# Patient Record
Sex: Male | Born: 1975 | Race: White | Hispanic: No | Marital: Single | Smoking: Former smoker
Health system: Southern US, Community
[De-identification: ages and names within clinical notes are randomized; demographics above are authoritative.]

## PROBLEM LIST (undated history)

## (undated) DIAGNOSIS — M545 Low back pain, unspecified: Secondary | ICD-10-CM

## (undated) DIAGNOSIS — J302 Other seasonal allergic rhinitis: Secondary | ICD-10-CM

## (undated) DIAGNOSIS — F419 Anxiety disorder, unspecified: Secondary | ICD-10-CM

## (undated) DIAGNOSIS — K429 Umbilical hernia without obstruction or gangrene: Secondary | ICD-10-CM

## (undated) DIAGNOSIS — F32A Depression, unspecified: Secondary | ICD-10-CM

## (undated) DIAGNOSIS — F329 Major depressive disorder, single episode, unspecified: Secondary | ICD-10-CM

## (undated) DIAGNOSIS — R51 Headache: Secondary | ICD-10-CM

---

## 2010-03-31 ENCOUNTER — Emergency Department (HOSPITAL_COMMUNITY): Admission: EM | Admit: 2010-03-31 | Discharge: 2010-03-31 | Payer: Self-pay | Admitting: Emergency Medicine

## 2010-12-28 ENCOUNTER — Emergency Department (HOSPITAL_BASED_OUTPATIENT_CLINIC_OR_DEPARTMENT_OTHER)
Admission: EM | Admit: 2010-12-28 | Discharge: 2010-12-28 | Disposition: A | Discharge status: Home / Self Care | Payer: Self-pay | Attending: Emergency Medicine | Admitting: Emergency Medicine

## 2010-12-28 DIAGNOSIS — R109 Unspecified abdominal pain: Secondary | ICD-10-CM | POA: Insufficient documentation

## 2010-12-28 DIAGNOSIS — K439 Ventral hernia without obstruction or gangrene: Secondary | ICD-10-CM | POA: Insufficient documentation

## 2011-03-11 ENCOUNTER — Ambulatory Visit (HOSPITAL_COMMUNITY): Payer: Self-pay | Admitting: Psychiatry

## 2011-04-03 ENCOUNTER — Ambulatory Visit (HOSPITAL_COMMUNITY): Payer: Self-pay | Admitting: Psychiatry

## 2011-10-02 ENCOUNTER — Emergency Department (HOSPITAL_COMMUNITY)
Admission: EM | Admit: 2011-10-02 | Discharge: 2011-10-02 | Disposition: A | Discharge status: Home / Self Care | Payer: No Typology Code available for payment source | Attending: Emergency Medicine | Admitting: Emergency Medicine

## 2011-10-02 ENCOUNTER — Emergency Department (HOSPITAL_COMMUNITY): Payer: No Typology Code available for payment source

## 2011-10-02 ENCOUNTER — Encounter: Payer: Self-pay | Admitting: *Deleted

## 2011-10-02 DIAGNOSIS — S8990XA Unspecified injury of unspecified lower leg, initial encounter: Secondary | ICD-10-CM | POA: Insufficient documentation

## 2011-10-02 DIAGNOSIS — M25579 Pain in unspecified ankle and joints of unspecified foot: Secondary | ICD-10-CM | POA: Insufficient documentation

## 2011-10-02 DIAGNOSIS — IMO0002 Reserved for concepts with insufficient information to code with codable children: Secondary | ICD-10-CM | POA: Insufficient documentation

## 2011-10-02 DIAGNOSIS — M7989 Other specified soft tissue disorders: Secondary | ICD-10-CM | POA: Insufficient documentation

## 2011-10-02 DIAGNOSIS — S99922A Unspecified injury of left foot, initial encounter: Secondary | ICD-10-CM

## 2011-10-02 DIAGNOSIS — Y9229 Other specified public building as the place of occurrence of the external cause: Secondary | ICD-10-CM | POA: Insufficient documentation

## 2011-10-02 HISTORY — DX: Major depressive disorder, single episode, unspecified: F32.9

## 2011-10-02 HISTORY — DX: Umbilical hernia without obstruction or gangrene: K42.9

## 2011-10-02 HISTORY — DX: Depression, unspecified: F32.A

## 2011-10-02 MED ORDER — OXYCODONE-ACETAMINOPHEN 5-325 MG PO TABS: 2.0000 | ORAL_TABLET | ORAL | Status: AC | PRN

## 2011-10-02 MED ORDER — OXYCODONE-ACETAMINOPHEN 5-325 MG PO TABS: 1.0000 | ORAL_TABLET | Freq: Once | ORAL | Status: DC
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
## 2011-10-02 NOTE — ED Notes (Signed)
Patient is at the desk and upset that he has not been treated more promptly. The patient is choosing to have no further care with [place] at [facility]. Patient is leaving AMA - PEr PA no orders to be carried out

## 2011-10-02 NOTE — ED Notes (Signed)
Pt presents with abrasion to left knee, +pp to left injured foot

## 2011-10-08 NOTE — ED Provider Notes (Signed)
Medical screening examination/treatment/procedure(s) were performed by non-physician practitioner and as supervising physician I was immediately available for consultation/collaboration.  Pershing Skidmore, MD 10/08/11 1956 

## 2012-01-26 ENCOUNTER — Emergency Department (INDEPENDENT_AMBULATORY_CARE_PROVIDER_SITE_OTHER): Payer: BC Managed Care – PPO

## 2012-01-26 ENCOUNTER — Emergency Department (HOSPITAL_BASED_OUTPATIENT_CLINIC_OR_DEPARTMENT_OTHER)
Admission: EM | Admit: 2012-01-26 | Discharge: 2012-01-26 | Disposition: A | Discharge status: Home / Self Care | Payer: BC Managed Care – PPO | Attending: Emergency Medicine | Admitting: Emergency Medicine

## 2012-01-26 ENCOUNTER — Encounter (HOSPITAL_BASED_OUTPATIENT_CLINIC_OR_DEPARTMENT_OTHER): Payer: Self-pay | Admitting: Emergency Medicine

## 2012-01-26 DIAGNOSIS — K5730 Diverticulosis of large intestine without perforation or abscess without bleeding: Secondary | ICD-10-CM

## 2012-01-26 DIAGNOSIS — K7689 Other specified diseases of liver: Secondary | ICD-10-CM | POA: Insufficient documentation

## 2012-01-26 DIAGNOSIS — R109 Unspecified abdominal pain: Secondary | ICD-10-CM

## 2012-01-26 DIAGNOSIS — K429 Umbilical hernia without obstruction or gangrene: Secondary | ICD-10-CM | POA: Insufficient documentation

## 2012-01-26 DIAGNOSIS — R110 Nausea: Secondary | ICD-10-CM | POA: Insufficient documentation

## 2012-01-26 DIAGNOSIS — R10819 Abdominal tenderness, unspecified site: Secondary | ICD-10-CM | POA: Insufficient documentation

## 2012-01-26 LAB — COMPREHENSIVE METABOLIC PANEL
ALT: 43 U/L (ref 0–53)
Calcium: 9.9 mg/dL (ref 8.4–10.5)
Chloride: 101 mEq/L (ref 96–112)
Creatinine, Ser: 0.8 mg/dL (ref 0.50–1.35)
Glucose, Bld: 90 mg/dL (ref 70–99)
Sodium: 137 mEq/L (ref 135–145)
Total Bilirubin: 0.4 mg/dL (ref 0.3–1.2)
Total Protein: 7.2 g/dL (ref 6.0–8.3)

## 2012-01-26 LAB — CBC
HCT: 44.1 % (ref 39.0–52.0)
RBC: 4.68 MIL/uL (ref 4.22–5.81)
RDW: 12.5 % (ref 11.5–15.5)

## 2012-01-26 LAB — DIFFERENTIAL
Basophils Absolute: 0 10*3/uL (ref 0.0–0.1)
Basophils Relative: 0 % (ref 0–1)
Eosinophils Absolute: 0.1 10*3/uL (ref 0.0–0.7)
Lymphocytes Relative: 27 % (ref 12–46)
Lymphs Abs: 2.9 10*3/uL (ref 0.7–4.0)
Monocytes Absolute: 0.6 10*3/uL (ref 0.1–1.0)
Neutro Abs: 7.2 10*3/uL (ref 1.7–7.7)

## 2012-01-26 LAB — URINALYSIS, ROUTINE W REFLEX MICROSCOPIC
Hgb urine dipstick: NEGATIVE
Nitrite: NEGATIVE
Protein, ur: NEGATIVE mg/dL
Urobilinogen, UA: 0.2 mg/dL (ref 0.0–1.0)
pH: 7 (ref 5.0–8.0)

## 2012-01-26 MED ORDER — ONDANSETRON HCL 4 MG/2ML IJ SOLN
4.0000 mg | Freq: Once | INTRAMUSCULAR | Status: AC
Start: 2012-01-26 — End: 2012-01-26
  Administered 2012-01-26: 4 mg via INTRAVENOUS
  Filled 2012-01-26: qty 2

## 2012-01-26 MED ORDER — MORPHINE SULFATE 4 MG/ML IJ SOLN
4.0000 mg | Freq: Once | INTRAMUSCULAR | Status: AC
  Administered 2012-01-26: 4 mg via INTRAVENOUS
  Filled 2012-01-26: qty 1

## 2012-01-26 MED ORDER — IOHEXOL 300 MG/ML  SOLN
36.0000 mL | Freq: Once | INTRAMUSCULAR | Status: AC | PRN
  Administered 2012-01-26: 36 mL via INTRAVENOUS

## 2012-01-26 MED ORDER — SODIUM CHLORIDE 0.9 % IV SOLN
Freq: Once | INTRAVENOUS | Status: AC
  Administered 2012-01-26: 17:00:00 via INTRAVENOUS

## 2012-01-26 MED ORDER — IOHEXOL 300 MG/ML  SOLN
100.0000 mL | Freq: Once | INTRAMUSCULAR | Status: AC | PRN
  Administered 2012-01-26: 100 mL via INTRAVENOUS

## 2012-01-26 MED ORDER — OXYCODONE-ACETAMINOPHEN 5-325 MG PO TABS: 1.0000 | ORAL_TABLET | Freq: Four times a day (QID) | ORAL | Status: AC | PRN

## 2012-01-26 NOTE — ED Provider Notes (Signed)
History     CSN: [id number]  Arrival date & time 01/26/12  1502   First MD Initiated Contact with Patient 01/26/12 1543      Chief Complaint  Patient presents with  . Abdominal Pain    (Consider location/radiation/quality/duration/timing/severity/associated sxs/prior treatment) HPI Comments: Was told one year ago had a hernia but had no health insurance.  Has an appointment with General Surgery in two weeks.  Patient is a 36 y.o. male presenting with abdominal pain. The history is provided by the patient.  Abdominal Pain The primary symptoms of the illness include abdominal pain and nausea. The primary symptoms of the illness do not include fever, vomiting, diarrhea or dysuria. Episode onset: ongoing for one year, worse the past 2 days. The onset of the illness was gradual. The problem has been rapidly worsening.  The patient has not had a change in bowel habit.    Past Medical History  Diagnosis Date  . Umbilical hernia   . Depression   . Bipolar disorder   . Hernia     History reviewed. No pertinent past surgical history.  No family history on file.  History  Substance Use Topics  . Smoking status: Former Games developer  . Smokeless tobacco: Not on file  . Alcohol Use: No     ocassionally      Review of Systems  Constitutional: Negative for fever.  Gastrointestinal: Positive for nausea and abdominal pain. Negative for vomiting and diarrhea.  Genitourinary: Negative for dysuria.  All other systems reviewed and are negative.    Allergies  Fish allergy and Imipramine  Home Medications   Current Outpatient Rx  Name Route Sig Dispense Refill  . ACETAMINOPHEN ER 650 MG PO TBCR Oral Take 650 mg by mouth 2 (two) times daily as needed. For pain    . DIPHENHYDRAMINE HCL 25 MG PO TABS Oral Take 25 mg by mouth daily.    . TRIPLE ANTIBIOTIC [phone] EX OINT Topical Apply 1 application topically 2 (two) times daily as needed. For cut and burn      BP 116/59  Pulse 78   Temp(Src) 98.4 F (36.9 C) (Oral)  Resp 24  Ht 6' (1.829 m)  Wt 185 lb (83.915 kg)  BMI 25.09 kg/m2  SpO2 95%  Physical Exam  Nursing note and vitals reviewed. Constitutional: He is oriented to person, place, and time. He appears well-developed and well-nourished. No distress.  HENT:  Head: Normocephalic and atraumatic.  Neck: Normal range of motion. Neck supple.  Cardiovascular: Normal rate and regular rhythm.   Pulmonary/Chest: Effort normal and breath sounds normal. No respiratory distress.  Abdominal: Soft. Bowel sounds are normal. He exhibits no distension. There is tenderness. There is no rebound and no guarding.       There is ttp in all four quadrants, but is worse in the umbilicus.  There is a small hernia palpable that is ttp but does not appear to be incarcerated.  Musculoskeletal: Normal range of motion. He exhibits no edema.  Neurological: He is alert and oriented to person, place, and time.  Skin: Skin is warm and dry. He is not diaphoretic.    ED Course  Procedures (including critical care time)   Labs Reviewed  CBC  DIFFERENTIAL  COMPREHENSIVE METABOLIC PANEL  LIPASE, BLOOD  URINALYSIS, ROUTINE W REFLEX MICROSCOPIC   No results found.   No diagnosis found.    MDM  The ct and labs look okay with the exception of a mildly elevated wbc, of  which I am unsure the significance.  The ct shows a small fat-containing umbilical hernia with no evidence of obstruction bowel involvement.  Will discharge to home with pain meds.  To see general surgery as scheduled.          Geoffery Lyons, MD 01/26/12 [phone]

## 2012-01-26 NOTE — Discharge Instructions (Signed)
Abdominal Pain  Abdominal pain can be caused by many things. Your caregiver decides the seriousness of your pain by an examination and possibly blood tests and X-rays. Many cases can be observed and treated at home. Most abdominal pain is not caused by a disease and will probably improve without treatment. However, in many cases, more time must pass before a clear cause of the pain can be found. Before that point, it may not be known if you need more testing, or if hospitalization or surgery is needed.  HOME CARE INSTRUCTIONS    Do not take laxatives unless directed by your caregiver.   Take pain medicine only as directed by your caregiver.   Only take over-the-counter or prescription medicines for pain, discomfort, or fever as directed by your caregiver.   Try a clear liquid diet (broth, tea, or water) for as long as directed by your caregiver. Slowly move to a bland diet as tolerated.  SEEK IMMEDIATE MEDICAL CARE IF:    The pain does not go away.   You have a fever.   You keep throwing up (vomiting).   The pain is felt only in portions of the abdomen. Pain in the right side could possibly be appendicitis. In an adult, pain in the left lower portion of the abdomen could be colitis or diverticulitis.   You pass bloody or black tarry stools.  MAKE SURE YOU:    Understand these instructions.   Will watch your condition.   Will get help right away if you are not doing well or get worse.  Document Released: 07/08/2005 Document Revised: 09/17/2011 Document Reviewed: 05/16/2008  ExitCare Patient Information 2012 [employer]  Hernia  A hernia occurs when an internal organ pushes out through a weak spot in the abdominal wall. Hernias most commonly occur in the groin and around the navel. Hernias often can be pushed back into place (reduced). Most hernias tend to get worse over time. Some abdominal hernias can get stuck in the opening (irreducible or incarcerated hernia) and cannot be reduced. An irreducible  abdominal hernia which is tightly squeezed into the opening is at risk for impaired blood supply (strangulated hernia). A strangulated hernia is a medical emergency. Because of the risk for an irreducible or strangulated hernia, surgery may be recommended to repair a hernia.  CAUSES    Heavy lifting.   Prolonged coughing.   Straining to have a bowel movement.   A cut (incision) made during an abdominal surgery.  HOME CARE INSTRUCTIONS    Bed rest is not required. You may continue your normal activities.   Avoid lifting more than 10 pounds (4.5 kg) or straining.   Cough gently. If you are a smoker it is best to stop. Even the best hernia repair can break down with the continual strain of coughing. Even if you do not have your hernia repaired, a cough will continue to aggravate the problem.   Do not wear anything tight over your hernia. Do not try to keep it in with an outside bandage or truss. These can damage abdominal contents if they are trapped within the hernia sac.   Eat a normal diet.   Avoid constipation. Straining over long periods of time will increase hernia size and encourage breakdown of repairs. If you cannot do this with diet alone, stool softeners may be used.  SEEK IMMEDIATE MEDICAL CARE IF:    You have a fever.   You develop increasing abdominal pain.   You feel nauseous   or vomit.   Your hernia is stuck outside the abdomen, looks discolored, feels hard, or is tender.   You have any changes in your bowel habits or in the hernia that are unusual for you.   You have increased pain or swelling around the hernia.   You cannot push the hernia back in place by applying gentle pressure while lying down.  MAKE SURE YOU:    Understand these instructions.   Will watch your condition.   Will get help right away if you are not doing well or get worse.  Document Released: 09/28/2005 Document Revised: 09/17/2011 Document Reviewed: 05/17/2008  Exit[employer] Patient Information 2012 [employer]

## 2012-01-26 NOTE — ED Notes (Signed)
Abd pain x 1 year on and off. Dx with hernia 1 year ago - states almost passed out from the pain today. Has an appt with surgeon on May 3

## 2012-02-10 DIAGNOSIS — K429 Umbilical hernia without obstruction or gangrene: Secondary | ICD-10-CM

## 2012-02-10 HISTORY — DX: Umbilical hernia without obstruction or gangrene: K42.9

## 2012-02-12 ENCOUNTER — Ambulatory Visit (INDEPENDENT_AMBULATORY_CARE_PROVIDER_SITE_OTHER): Payer: BC Managed Care – PPO | Admitting: General Surgery

## 2012-02-12 ENCOUNTER — Encounter (INDEPENDENT_AMBULATORY_CARE_PROVIDER_SITE_OTHER): Payer: Self-pay | Admitting: General Surgery

## 2012-02-12 VITALS — BP 122/82 | HR 66 | Temp 97.0°F | Resp 10 | Wt 237.0 lb

## 2012-02-12 DIAGNOSIS — K429 Umbilical hernia without obstruction or gangrene: Secondary | ICD-10-CM | POA: Insufficient documentation

## 2012-02-12 NOTE — Progress Notes (Signed)
Patient ID: Julian Cook, male   DOB: 02/13/1976, 35 y.o.   MRN: [id number]  Chief Complaint  Patient presents with  . Umbilical Hernia    HPI Julian Cook is a 35 y.o. male.   HPI 35-year-old Caucasian male was referred by Dr Delo because of umbilical hernia. He states that his had an umbilical hernia for about one year. He states that it started bothering him several weeks ago. He describes soreness in his abdominal wall. He also describes it as it is belly button being on fire. He also complains of some shortness of breath. He denies any history of asthma. He denies any cough or sputum production. He is able to walk around at work without any shortness of breath. He had a severe episode of periumbilical pain prompting him to go to the emergency room. In the ER, a CT scan of his abdomen was performed which only revealed a small umbilical hernia. It only contained fat. His labs were normal. He denies any chest pain or chest pressure. He has had some nausea. He denies any vomiting. He will occasionally get some diarrhea after eating certain foods. He denies any melena or hematochezia. He denies any weight loss. He denies any travel or pain urinating. He quit smoking last year Past Medical History  Diagnosis Date  . Umbilical hernia   . Depression   . Bipolar disorder   . Hernia     No past surgical history on file.  No family history on file.  Social History History  Substance Use Topics  . Smoking status: Former Smoker  . Smokeless tobacco: Not on file  . Alcohol Use: No     ocassionally    Allergies  Allergen Reactions  . Fish Allergy Anaphylaxis  . Imipramine Rash    Current Outpatient Prescriptions  Medication Sig Dispense Refill  . acetaminophen (TYLENOL ARTHRITIS PAIN) 650 MG CR tablet Take 650 mg by mouth 2 (two) times daily as needed. For pain      . diphenhydrAMINE (BENADRYL) 25 MG tablet Take 25 mg by mouth daily.      . neomycin-bacitracin-polymyxin (NEOSPORIN)  5-400-5000 ointment Apply 1 application topically 2 (two) times daily as needed. For cut and burn        Review of Systems Review of Systems  Constitutional: Negative for fever, chills, appetite change, fatigue and unexpected weight change.  HENT: Negative for neck pain and neck stiffness.   Eyes: Negative for photophobia and visual disturbance.  Respiratory: Positive for shortness of breath. Negative for cough, chest tightness, wheezing and stridor.   Cardiovascular: Negative for chest pain, palpitations and leg swelling.  Gastrointestinal:       See hpi  Genitourinary: Negative for dysuria and hematuria.  Musculoskeletal: Negative for back pain and gait problem.  Neurological: Negative for seizures, speech difficulty, light-headedness and numbness.  Psychiatric/Behavioral: Negative for confusion and decreased concentration.    Blood pressure 122/82, pulse 66, temperature 97 F (36.1 C), resp. rate 10, weight 237 lb (107.502 kg).  Physical Exam Physical Exam  Vitals reviewed. Constitutional: He is oriented to person, place, and time. He appears well-developed and well-nourished. No distress.       obese  HENT:  Head: Normocephalic and atraumatic.  Right Ear: External ear normal.  Left Ear: External ear normal.  Mouth/Throat: No oropharyngeal exudate.  Eyes: Conjunctivae are normal. No scleral icterus.  Neck: Normal range of motion. Neck supple. No tracheal deviation present. No thyromegaly present.  Cardiovascular: Normal rate   and normal heart sounds.   No murmur heard. Pulmonary/Chest: Effort normal and breath sounds normal. No stridor. No respiratory distress. He has no wheezes. He has no rales.  Abdominal: Soft. Bowel sounds are normal. He exhibits no distension.       Mild tenderness scattered; fascial defect at umbilicus about 1.5cm. Reducible. No skin changes.   Musculoskeletal: Normal range of motion. He exhibits no edema and no tenderness.  Lymphadenopathy:    He has  no cervical adenopathy.  Neurological: He is alert and oriented to person, place, and time. He exhibits normal muscle tone.  Skin: Skin is warm and dry. No rash noted. He is not diaphoretic. No erythema.  Psychiatric: Judgment and thought content normal.    Data Reviewed [facility] note from 4/16 CT abd/pelvis 01/2012 - small 1.7cm wide umbilical hernia  Assessment    Obesity Symptomatic umbilical hernia    Plan    We discussed the etiology of ventral hernias. We discussed the signs and symptoms of incarceration and strangulation. The patient was given educational material. I also drew diagrams.  We discussed nonoperative and operative management. With respect to operative management, we discussed open repair. I discussed the typical aftercare with the procedure.   The patient has elected to proceed with OPEN UMBILICAL HERNIA REPAIR WITH MESH  We discussed the risk and benefits of surgery including but not limited to bleeding, infection, injury to surrounding structures, hernia recurrence, mesh complications, hematoma/seroma formation, blood clot formation, urinary retention, post operative ileus, general anesthesia risk, long-term abdominal pain. We discussed that this procedure can be quite uncomfortable and difficult to recover from based on how the mesh is secured to the abdominal wall. We discussed the importance of avoiding heavy lifting and straining for a period of 6 weeks. I do not believe a hernia repair will improve his shortness of breath. We will check a CXR and labs prior to surgery.  EPIC was down at the time of the pt's visit so our office will contact him to schedule surgery.   Gearld Kerstein M. Estefany Goebel, MD, FACS General, Bariatric, & Minimally Invasive Surgery [facility]         Tamar Miano M 02/12/2012, 2:02 PM    

## 2012-03-01 ENCOUNTER — Encounter (HOSPITAL_BASED_OUTPATIENT_CLINIC_OR_DEPARTMENT_OTHER): Payer: Self-pay | Admitting: *Deleted

## 2012-03-09 ENCOUNTER — Telehealth (INDEPENDENT_AMBULATORY_CARE_PROVIDER_SITE_OTHER): Payer: Self-pay | Admitting: General Surgery

## 2012-03-09 NOTE — Telephone Encounter (Signed)
LISA FROM [facility] CALLED TO REQUEST ORDERS TO BE PUT IN EPIC FOR PT Julian Cook/DOB-1975/12/15. SURGERY IS TOMORROW/03-10-12/GY

## 2012-03-09 NOTE — Progress Notes (Signed)
Spoke with Germaine at Dr. Tawana Scale office to inform them that no pre-op orders are in the computer -- she will send him an e-mail.

## 2012-03-10 ENCOUNTER — Encounter (HOSPITAL_BASED_OUTPATIENT_CLINIC_OR_DEPARTMENT_OTHER): Payer: Self-pay | Admitting: Anesthesiology

## 2012-03-10 ENCOUNTER — Ambulatory Visit (HOSPITAL_BASED_OUTPATIENT_CLINIC_OR_DEPARTMENT_OTHER): Payer: BC Managed Care – PPO | Admitting: Anesthesiology

## 2012-03-10 ENCOUNTER — Encounter (HOSPITAL_BASED_OUTPATIENT_CLINIC_OR_DEPARTMENT_OTHER): Payer: Self-pay

## 2012-03-10 ENCOUNTER — Encounter (HOSPITAL_BASED_OUTPATIENT_CLINIC_OR_DEPARTMENT_OTHER)
Admission: RE | Disposition: A | Discharge status: Home / Self Care | Payer: Self-pay | Source: Ambulatory Visit | Attending: General Surgery

## 2012-03-10 ENCOUNTER — Other Ambulatory Visit (INDEPENDENT_AMBULATORY_CARE_PROVIDER_SITE_OTHER): Payer: Self-pay | Admitting: General Surgery

## 2012-03-10 ENCOUNTER — Ambulatory Visit (HOSPITAL_BASED_OUTPATIENT_CLINIC_OR_DEPARTMENT_OTHER)
Admission: RE | Admit: 2012-03-10 | Discharge: 2012-03-10 | Disposition: A | Discharge status: Home / Self Care | Payer: BC Managed Care – PPO | Source: Ambulatory Visit | Attending: General Surgery | Admitting: General Surgery

## 2012-03-10 DIAGNOSIS — K429 Umbilical hernia without obstruction or gangrene: Secondary | ICD-10-CM

## 2012-03-10 HISTORY — DX: Other seasonal allergic rhinitis: J30.2

## 2012-03-10 HISTORY — PX: UMBILICAL HERNIA REPAIR: SHX196

## 2012-03-10 HISTORY — DX: Headache: R51

## 2012-03-10 SURGERY — REPAIR, HERNIA, UMBILICAL, ADULT
Anesthesia: General | Site: Abdomen | Wound class: Clean

## 2012-03-10 MED ORDER — ONDANSETRON HCL 4 MG/2ML IJ SOLN
INTRAMUSCULAR | Status: DC | PRN
  Administered 2012-03-10: 4 mg via INTRAVENOUS

## 2012-03-10 MED ORDER — MIDAZOLAM HCL 2 MG/2ML IJ SOLN: 1.0000 mg | INTRAMUSCULAR | Status: DC | PRN

## 2012-03-10 MED ORDER — ROCURONIUM BROMIDE 100 MG/10ML IV SOLN
INTRAVENOUS | Status: DC | PRN
  Administered 2012-03-10: 30 mg via INTRAVENOUS

## 2012-03-10 MED ORDER — CEFAZOLIN SODIUM-DEXTROSE 2-3 GM-% IV SOLR: 2.0000 g | INTRAVENOUS | Status: DC

## 2012-03-10 MED ORDER — LIDOCAINE HCL (CARDIAC) 20 MG/ML IV SOLN
INTRAVENOUS | Status: DC | PRN
  Administered 2012-03-10: 100 mg via INTRAVENOUS

## 2012-03-10 MED ORDER — OXYCODONE-ACETAMINOPHEN 5-325 MG PO TABS
1.0000 | ORAL_TABLET | Freq: Once | ORAL | Status: AC
  Administered 2012-03-10: 1 via ORAL

## 2012-03-10 MED ORDER — DEXAMETHASONE SODIUM PHOSPHATE 4 MG/ML IJ SOLN
INTRAMUSCULAR | Status: DC | PRN
  Administered 2012-03-10: 10 mg via INTRAVENOUS

## 2012-03-10 MED ORDER — PROPOFOL 10 MG/ML IV EMUL
INTRAVENOUS | Status: DC | PRN
  Administered 2012-03-10: 200 mg via INTRAVENOUS
  Administered 2012-03-10: 100 mg via INTRAVENOUS

## 2012-03-10 MED ORDER — GLYCOPYRROLATE 0.2 MG/ML IJ SOLN
INTRAMUSCULAR | Status: DC | PRN
  Administered 2012-03-10: 0.4 mg via INTRAVENOUS

## 2012-03-10 MED ORDER — MIDAZOLAM HCL 5 MG/5ML IJ SOLN
INTRAMUSCULAR | Status: DC | PRN
  Administered 2012-03-10: 2 mg via INTRAVENOUS

## 2012-03-10 MED ORDER — HYDROMORPHONE HCL PF 1 MG/ML IJ SOLN
0.2500 mg | INTRAMUSCULAR | Status: DC | PRN
  Administered 2012-03-10: 0.25 mg via INTRAVENOUS
  Administered 2012-03-10: 0.5 mg via INTRAVENOUS

## 2012-03-10 MED ORDER — LACTATED RINGERS IV SOLN
INTRAVENOUS | Status: DC
  Administered 2012-03-10 (×2): via INTRAVENOUS
  Administered 2012-03-10: 20 mL/h via INTRAVENOUS

## 2012-03-10 MED ORDER — FENTANYL CITRATE 0.05 MG/ML IJ SOLN
INTRAMUSCULAR | Status: DC | PRN
  Administered 2012-03-10: 50 ug via INTRAVENOUS
  Administered 2012-03-10: 100 ug via INTRAVENOUS
  Administered 2012-03-10 (×3): 50 ug via INTRAVENOUS

## 2012-03-10 MED ORDER — LORAZEPAM 2 MG/ML IJ SOLN: 1.0000 mg | Freq: Once | INTRAMUSCULAR | Status: DC | PRN

## 2012-03-10 MED ORDER — SUCCINYLCHOLINE CHLORIDE 20 MG/ML IJ SOLN
INTRAMUSCULAR | Status: DC | PRN
  Administered 2012-03-10: 100 mg via INTRAVENOUS

## 2012-03-10 MED ORDER — NEOSTIGMINE METHYLSULFATE 1 MG/ML IJ SOLN
INTRAMUSCULAR | Status: DC | PRN
  Administered 2012-03-10: 3 mg via INTRAVENOUS

## 2012-03-10 MED ORDER — FENTANYL CITRATE 0.05 MG/ML IJ SOLN: 50.0000 ug | INTRAMUSCULAR | Status: DC | PRN

## 2012-03-10 MED ORDER — OXYCODONE-ACETAMINOPHEN 5-325 MG PO TABS: 1.0000 | ORAL_TABLET | ORAL | Status: AC | PRN

## 2012-03-10 MED ORDER — BUPIVACAINE-EPINEPHRINE PF 0.25-1:200000 % IJ SOLN
INTRAMUSCULAR | Status: DC | PRN
  Administered 2012-03-10: 22 mL

## 2012-03-10 SURGICAL SUPPLY — 46 items
BENZOIN TINCTURE PRP APPL 2/3 (GAUZE/BANDAGES/DRESSINGS) ×2 IMPLANT
BLADE SURG 15 STRL LF DISP TIS (BLADE) ×1 IMPLANT
BLADE SURG 15 STRL SS (BLADE) ×1
BLADE SURG ROTATE 9660 (MISCELLANEOUS) ×2 IMPLANT
CHLORAPREP W/TINT 26ML (MISCELLANEOUS) ×2 IMPLANT
COTTONBALL LRG STERILE PKG (GAUZE/BANDAGES/DRESSINGS) IMPLANT
COVER MAYO STAND STRL (DRAPES) ×2 IMPLANT
COVER TABLE BACK 60X90 (DRAPES) ×2 IMPLANT
DECANTER SPIKE VIAL GLASS SM (MISCELLANEOUS) IMPLANT
DERMABOND ADVANCED (GAUZE/BANDAGES/DRESSINGS)
DERMABOND ADVANCED .7 DNX12 (GAUZE/BANDAGES/DRESSINGS) IMPLANT
DRAPE PED LAPAROTOMY (DRAPES) ×2 IMPLANT
DRAPE UTILITY XL STRL (DRAPES) ×2 IMPLANT
DRSG TEGADERM 4X4.75 (GAUZE/BANDAGES/DRESSINGS) ×2 IMPLANT
ELECT COATED BLADE 2.86 ST (ELECTRODE) ×2 IMPLANT
ELECT REM PT RETURN 9FT ADLT (ELECTROSURGICAL) ×2
ELECTRODE REM PT RTRN 9FT ADLT (ELECTROSURGICAL) ×1 IMPLANT
GLOVE BIO SURGEON STRL SZ7 (GLOVE) ×2 IMPLANT
GLOVE BIO SURGEON STRL SZ7.5 (GLOVE) ×4 IMPLANT
GLOVE BIOGEL PI IND STRL 7.0 (GLOVE) ×1 IMPLANT
GLOVE BIOGEL PI IND STRL 8 (GLOVE) ×2 IMPLANT
GLOVE BIOGEL PI INDICATOR 7.0 (GLOVE) ×1
GLOVE BIOGEL PI INDICATOR 8 (GLOVE) ×2
GOWN PREVENTION PLUS XLARGE (GOWN DISPOSABLE) IMPLANT
GOWN PREVENTION PLUS XXLARGE (GOWN DISPOSABLE) IMPLANT
NEEDLE HYPO 22GX1.5 SAFETY (NEEDLE) ×2 IMPLANT
NS IRRIG 1000ML POUR BTL (IV SOLUTION) ×2 IMPLANT
PACK BASIN DAY SURGERY FS (CUSTOM PROCEDURE TRAY) ×2 IMPLANT
PATCH VENTRAL SMALL 4.3 (Mesh Specialty) ×2 IMPLANT
PENCIL BUTTON HOLSTER BLD 10FT (ELECTRODE) ×2 IMPLANT
SLEEVE SCD COMPRESS KNEE MED (MISCELLANEOUS) ×2 IMPLANT
SPONGE GAUZE 4X4 12PLY (GAUZE/BANDAGES/DRESSINGS) ×2 IMPLANT
SPONGE LAP 4X18 X RAY DECT (DISPOSABLE) ×2 IMPLANT
STRIP CLOSURE SKIN 1/2X4 (GAUZE/BANDAGES/DRESSINGS) ×2 IMPLANT
SUT ETHIBOND 0 MO6 C/R (SUTURE) IMPLANT
SUT MON AB 4-0 PC3 18 (SUTURE) ×2 IMPLANT
SUT NOVA 0 T19/GS 22DT (SUTURE) ×4 IMPLANT
SUT VIC AB 0 SH 27 (SUTURE) IMPLANT
SUT VIC AB 2-0 SH 27 (SUTURE)
SUT VIC AB 2-0 SH 27XBRD (SUTURE) IMPLANT
SUT VICRYL 3-0 CR8 SH (SUTURE) ×2 IMPLANT
SUT VICRYL AB 3 0 TIES (SUTURE) IMPLANT
SYR CONTROL 10ML LL (SYRINGE) ×2 IMPLANT
TOWEL OR 17X24 6PK STRL BLUE (TOWEL DISPOSABLE) IMPLANT
TOWEL OR NON WOVEN STRL DISP B (DISPOSABLE) ×2 IMPLANT
WATER STERILE IRR 1000ML POUR (IV SOLUTION) IMPLANT

## 2012-03-10 NOTE — Transfer of Care (Signed)
Immediate Anesthesia Transfer of Care Note  Patient: Julian Cook  Procedure(s) Performed: Procedure(s) (LRB): HERNIA REPAIR UMBILICAL ADULT (N/A) INSERTION OF MESH (N/A)  Patient Location: PACU  Anesthesia Type: General  Level of Consciousness: awake  Airway & Oxygen Therapy: Patient Spontanous Breathing and Patient connected to face mask oxygen  Post-op Assessment: Report given to PACU RN and Post -op Vital signs reviewed and stable  Post vital signs: Reviewed and stable  Complications: No apparent anesthesia complications

## 2012-03-10 NOTE — Anesthesia Postprocedure Evaluation (Signed)
  Anesthesia Post-op Note  Patient: Julian Cook  Procedure(s) Performed: Procedure(s) (LRB): HERNIA REPAIR UMBILICAL ADULT (N/A) INSERTION OF MESH (N/A)  Patient Location: PACU  Anesthesia Type: General  Level of Consciousness: awake  Airway and Oxygen Therapy: Patient Spontanous Breathing  Post-op Pain: mild  Post-op Assessment: Post-op Vital signs reviewed, Patient's Cardiovascular Status Stable, Respiratory Function Stable, Patent Airway, No signs of Nausea or vomiting, Adequate PO intake and Pain level controlled  Post-op Vital Signs: stable  Complications: No apparent anesthesia complications

## 2012-03-10 NOTE — Discharge Instructions (Signed)
[facility]  UMBILICAL OR INGUINAL HERNIA REPAIR: POST OP INSTRUCTIONS  Always review your discharge instruction sheet given to you by the facility where your surgery was performed. IF YOU HAVE DISABILITY OR FAMILY LEAVE FORMS, YOU MUST BRING THEM TO THE OFFICE FOR PROCESSING.   DO NOT GIVE THEM TO YOUR DOCTOR.  1. A  prescription for pain medication may be given to you upon discharge.  Take your pain medication as prescribed, if needed.  If narcotic pain medicine is not needed, then you may take acetaminophen (Tylenol) or ibuprofen (Advil) as needed. 2. Take your usually prescribed medications unless otherwise directed. 3. If you need a refill on your pain medication, please contact your pharmacy.  They will contact our office to request authorization. Prescriptions will not be filled after 5 pm or on week-ends. 4. You should follow a light diet the first 24 hours after arrival home, such as soup and crackers, etc.  Be sure to include lots of fluids daily.  Resume your normal diet the day after surgery. 5. Most patients will experience some swelling and bruising around the umbilicus.  Ice packs and reclining will help.  Swelling and bruising can take several days to resolve.  6. It is common to experience some constipation if taking pain medication after surgery.  Increasing fluid intake and taking a stool softener (such as Colace) will usually help or prevent this problem from occurring.  A mild laxative (Milk of Magnesia or Miralax) should be taken according to package directions if there are no bowel movements after 48 hours. 7. Unless discharge instructions indicate otherwise, you may remove your bandages 48 hours after surgery, and you may shower at that time.  You  have steri-strips (small skin tapes) in place directly over the incision.  These strips should be left on the skin for 7-10 days.   8. ACTIVITIES:  You may resume regular (light) daily activities beginning the  next day--such as daily self-care, walking, climbing stairs--gradually increasing activities as tolerated.  You may have sexual intercourse when it is comfortable.  Refrain from any heavy lifting or straining until approved by your doctor. a. You may drive when you are no longer taking prescription pain medication, you can comfortably wear a seatbelt, and you can safely maneuver your car and apply brakes. b. RETURN TO WORK: 1-2 weeks with RESTRICTIONS 9. You should see your doctor in the office for a follow-up appointment approximately 2-3 weeks after your surgery.  Make sure that you call for this appointment within a day or two after you arrive home to insure a convenient appointment time. 10. OTHER INSTRUCTIONS: DO NOT LIFT, PUSH, OR PULL ANYTHING GREATER THAN 15 POUNDS FOR AT LEAST 4 WEEKS    WHEN TO CALL YOUR DOCTOR: 1. Fever over 101.0 2. Inability to urinate 3. Nausea and/or vomiting 4. Extreme swelling or bruising 5. Continued bleeding from incision. 6. Increased pain, redness, or drainage from the incision  The clinic staff is available to answer your questions during regular business hours.  Please don't hesitate to call and ask to speak to one of the nurses for clinical concerns.  If you have a medical emergency, go to the nearest emergency room or call 911.  A surgeon from [facility] is always on call at the hospital   [address], [place], [place]  [address] ?  [address] [address], [place], [place]   [address] [phone] ? [phone] ? FAX [phone]  Web site: [email or web address]    Post Anesthesia Home Care Instructions  Activity: Get plenty of rest for the remainder of the day. A responsible adult should stay with you for 24 hours following the procedure.  For the next 24 hours, DO NOT: -Drive a car -Advertising copywriter -Drink alcoholic beverages -Take any medication unless instructed by your physician -Make any legal  decisions or sign important papers.  Meals: Start with liquid foods such as gelatin or soup. Progress to regular foods as tolerated. Avoid greasy, spicy, heavy foods. If nausea and/or vomiting occur, drink only clear liquids until the nausea and/or vomiting subsides. Call your physician if vomiting continues.  Special Instructions/Symptoms: Your throat may feel dry or sore from the anesthesia or the breathing tube placed in your throat during surgery. If this causes discomfort, gargle with warm salt water. The discomfort should disappear within 24 hours.

## 2012-03-10 NOTE — Anesthesia Procedure Notes (Signed)
Procedure Name: Intubation Date/Time: 03/10/2012 10:21 AM Performed by: Zenia Resides D Pre-anesthesia Checklist: Patient identified, Emergency Drugs available, Suction available and Patient being monitored Patient Re-evaluated:Patient Re-evaluated prior to inductionOxygen Delivery Method: Circle System Utilized Preoxygenation: Pre-oxygenation with 100% oxygen Intubation Type: IV induction and Rapid sequence Ventilation: Mask ventilation without difficulty Laryngoscope Size: Mac and 3 Grade View: Grade II Tube type: Oral Number of attempts: 1 Airway Equipment and Method: stylet and oral airway Placement Confirmation: ETT inserted through vocal cords under direct vision,  positive ETCO2 and breath sounds checked- equal and bilateral Secured at: 23 cm Tube secured with: Tape Dental Injury: Teeth and Oropharynx as per pre-operative assessment

## 2012-03-10 NOTE — Anesthesia Preprocedure Evaluation (Addendum)
Anesthesia Evaluation  Patient identified by MRN, date of birth, ID band Patient awake    Reviewed: Allergy & Precautions, H&P , NPO status , Patient's Chart, lab work & pertinent test results  Airway Mallampati: II TM Distance: >3 FB Neck ROM: Full    Dental   Pulmonary    Pulmonary exam normal       Cardiovascular     Neuro/Psych  Headaches, Anxiety Depression    GI/Hepatic   Endo/Other    Renal/GU      Musculoskeletal   Abdominal (+) + obese,   Peds  Hematology   Anesthesia Other Findings   Reproductive/Obstetrics                          Anesthesia Physical Anesthesia Plan  ASA: II  Anesthesia Plan: General   Post-op Pain Management:    Induction: Intravenous  Airway Management Planned: Oral ETT  Additional Equipment:   Intra-op Plan:   Post-operative Plan: Extubation in OR  Informed Consent: I have reviewed the patients History and Physical, chart, labs and discussed the procedure including the risks, benefits and alternatives for the proposed anesthesia with the patient or authorized representative who has indicated his/her understanding and acceptance.     Plan Discussed with: CRNA and Surgeon  Anesthesia Plan Comments: (Pt with severe needle phobia)       Anesthesia Quick Evaluation

## 2012-03-10 NOTE — Interval H&P Note (Signed)
History and Physical Interval Note:  03/10/2012 9:46 AM  Julian Cook  has presented today for surgery, with the diagnosis of umbilical hernia  The various methods of treatment have been discussed with the patient and family. After consideration of risks, benefits and other options for treatment, the patient has consented to  Procedure(s) (LRB): HERNIA REPAIR UMBILICAL ADULT (N/A) INSERTION OF MESH (N/A) as a surgical intervention .  The patients' history has been reviewed, patient examined, no change in status, stable for surgery.  I have reviewed the patients' chart and labs.  Questions were answered to the patient's satisfaction.    [name]. Andrey Campanile, MD, FACS General, Bariatric, & Minimally Invasive Surgery [facility], [place]  [facility] M

## 2012-03-10 NOTE — H&P (View-Only) (Signed)
Patient ID: Julian Cook, male   DOB: Feb 19, 1976, 36 y.o.   MRN: [id number]  Chief Complaint  Patient presents with  . Umbilical Hernia    HPI Julian Cook is a 36 y.o. male.   HPI 36 year old Caucasian male was referred by Dr Judd Lien because of umbilical hernia. He states that his had an umbilical hernia for about one year. He states that it started bothering him several weeks ago. He describes soreness in his abdominal wall. He also describes it as it is belly button being on fire. He also complains of some shortness of breath. He denies any history of asthma. He denies any cough or sputum production. He is able to walk around at work without any shortness of breath. He had a severe episode of periumbilical pain prompting him to go to the emergency room. In the ER, a CT scan of his abdomen was performed which only revealed a small umbilical hernia. It only contained fat. His labs were normal. He denies any chest pain or chest pressure. He has had some nausea. He denies any vomiting. He will occasionally get some diarrhea after eating certain foods. He denies any melena or hematochezia. He denies any weight loss. He denies any travel or pain urinating. He quit smoking last year Past Medical History  Diagnosis Date  . Umbilical hernia   . Depression   . Bipolar disorder   . Hernia     No past surgical history on file.  No family history on file.  Social History History  Substance Use Topics  . Smoking status: Former Games developer  . Smokeless tobacco: Not on file  . Alcohol Use: No     ocassionally    Allergies  Allergen Reactions  . Fish Allergy Anaphylaxis  . Imipramine Rash    Current Outpatient Prescriptions  Medication Sig Dispense Refill  . acetaminophen (TYLENOL ARTHRITIS PAIN) 650 MG CR tablet Take 650 mg by mouth 2 (two) times daily as needed. For pain      . diphenhydrAMINE (BENADRYL) 25 MG tablet Take 25 mg by mouth daily.      Marland Kitchen neomycin-bacitracin-polymyxin (NEOSPORIN)  [phone] ointment Apply 1 application topically 2 (two) times daily as needed. For cut and burn        Review of Systems Review of Systems  Constitutional: Negative for fever, chills, appetite change, fatigue and unexpected weight change.  HENT: Negative for neck pain and neck stiffness.   Eyes: Negative for photophobia and visual disturbance.  Respiratory: Positive for shortness of breath. Negative for cough, chest tightness, wheezing and stridor.   Cardiovascular: Negative for chest pain, palpitations and leg swelling.  Gastrointestinal:       See hpi  Genitourinary: Negative for dysuria and hematuria.  Musculoskeletal: Negative for back pain and gait problem.  Neurological: Negative for seizures, speech difficulty, light-headedness and numbness.  Psychiatric/Behavioral: Negative for confusion and decreased concentration.    Blood pressure 122/82, pulse 66, temperature 97 F (36.1 C), resp. rate 10, weight 237 lb (107.502 kg).  Physical Exam Physical Exam  Vitals reviewed. Constitutional: He is oriented to person, place, and time. He appears well-developed and well-nourished. No distress.       obese  HENT:  Head: Normocephalic and atraumatic.  Right Ear: External ear normal.  Left Ear: External ear normal.  Mouth/Throat: No oropharyngeal exudate.  Eyes: Conjunctivae are normal. No scleral icterus.  Neck: Normal range of motion. Neck supple. No tracheal deviation present. No thyromegaly present.  Cardiovascular: Normal rate  and normal heart sounds.   No murmur heard. Pulmonary/Chest: Effort normal and breath sounds normal. No stridor. No respiratory distress. He has no wheezes. He has no rales.  Abdominal: Soft. Bowel sounds are normal. He exhibits no distension.       Mild tenderness scattered; fascial defect at umbilicus about 1.5cm. Reducible. No skin changes.   Musculoskeletal: Normal range of motion. He exhibits no edema and no tenderness.  Lymphadenopathy:    He has  no cervical adenopathy.  Neurological: He is alert and oriented to person, place, and time. He exhibits normal muscle tone.  Skin: Skin is warm and dry. No rash noted. He is not diaphoretic. No erythema.  Psychiatric: Judgment and thought content normal.    Data Reviewed [facility] note from 4/16 CT abd/pelvis 01/2012 - small 1.7cm wide umbilical hernia  Assessment    Obesity Symptomatic umbilical hernia    Plan    We discussed the etiology of ventral hernias. We discussed the signs and symptoms of incarceration and strangulation. The patient was given educational material. I also drew diagrams.  We discussed nonoperative and operative management. With respect to operative management, we discussed open repair. I discussed the typical aftercare with the procedure.   The patient has elected to proceed with OPEN UMBILICAL HERNIA REPAIR WITH MESH  We discussed the risk and benefits of surgery including but not limited to bleeding, infection, injury to surrounding structures, hernia recurrence, mesh complications, hematoma/seroma formation, blood clot formation, urinary retention, post operative ileus, general anesthesia risk, long-term abdominal pain. We discussed that this procedure can be quite uncomfortable and difficult to recover from based on how the mesh is secured to the abdominal wall. We discussed the importance of avoiding heavy lifting and straining for a period of 6 weeks. I do not believe a hernia repair will improve his shortness of breath. We will check a CXR and labs prior to surgery.  EPIC was down at the time of the pt's visit so our office will contact him to schedule surgery.   [name]. Andrey Campanile, MD, FACS General, Bariatric, & Minimally Invasive Surgery [facility], [place]         [facility] M 02/12/2012, 2:02 PM

## 2012-03-10 NOTE — Op Note (Signed)
Umbilical Herniorrhaphy with Mesh Procedure Note  Indications: Symptomatic umbilical hernia   Pre-operative Diagnosis: umbilical hernia   Post-operative Diagnosis: umbilical hernia   Surgeon: [name]. Andrey Campanile, MD FACS   Assistants: none   Anesthesia: General endotracheal anesthesia and Local anesthesia 0.25%marcaine with epi   ASA Class: II  Procedure Details  The patient was seen in the Holding Room. The risks, benefits, complications, treatment options, and expected outcomes were discussed with the patient. The possibilities of reaction to medication, pulmonary aspiration, perforation of viscus, bleeding, recurrent infection, hernia recurrence, the need for additional procedures, failure to diagnose a condition, and creating a complication requiring transfusion or operation were discussed with the patient. The patient concurred with the proposed plan, giving informed consent. The site of surgery properly noted/marked.   The patient was taken to Operating Room # 8 at [facility], identified as Julian Cook and the procedure verified as Umbilical Herniorrhaphy. A Time Out was held and the above information confirmed. The patient received IV antibiotics prior to the procedure.   The patient was placed supine. After establishing general anesthesia, the abdomen was prepped with Choloraprep and draped in standard fashion. 0.250% Marcaine with epinephrine was used to anesthetize the skin. A 6cm transverse infraumbilical incision was created. Dissection was carried down to the hernia sac located above the fascia and was mobilized from surrounding structures. Intact fascia was identified circumferentially around the defect. Skin and soft tissue was mobilized from the surface of the fascia in a circumferential manner. A finger sweep was performed underneath the fascia to ensure there were no adhesions to the anterior abdominal wall. A 4.3cm round Proceed  ventralpatch was then placed thru the  defect. The tabs were lifted to ensure that the mesh was flush against the abdominal wall. I then ran my finger around the inside of the defect to ensure nothing was trapped between the mesh and the abdominal wall. A 0-novafil suture was used to secure the base of each tab to the fascia and then the excess tab was trimmed at the fascial level. The fascia was then closed primarily over the mesh with 4 interrupted 0-novafil sutures. The cavity was irrigated and additional local was infiltrated in the subcutaneous tissue and fascia. The umbilical stalk was then tacked back down to the fascia with two 3-0 vicryl sutures. Hemostasis was confirmed. The soft tissue was irrigated and closed in layers with inverted interrupted 3-0 vicryl sutures for the deep dermis. The skin incision was closed with a 4-0 monocryl subcuticular closure. Steri-Strips followed by a 4x4 gauze and a tegaderm were applied at the end of the operation.   Instrument, sponge, and needle counts were correct prior to closure and at the conclusion of the case.   Findings:  A 1.4 cm fascial defect   Estimated Blood Loss: Minimal   Drains: none   Implants: 4.3 cm Proceed Ventralpatch   Complications: None; patient tolerated the procedure well.   Disposition: PACU - hemodynamically stable.   Condition: stable  [name]. Andrey Campanile, MD, FACS General, Bariatric, & Minimally Invasive Surgery [facility], [place]

## 2012-03-11 ENCOUNTER — Encounter (HOSPITAL_BASED_OUTPATIENT_CLINIC_OR_DEPARTMENT_OTHER): Payer: Self-pay | Admitting: General Surgery

## 2012-03-30 ENCOUNTER — Ambulatory Visit (INDEPENDENT_AMBULATORY_CARE_PROVIDER_SITE_OTHER): Payer: BC Managed Care – PPO | Admitting: General Surgery

## 2012-03-30 ENCOUNTER — Encounter (INDEPENDENT_AMBULATORY_CARE_PROVIDER_SITE_OTHER): Payer: Self-pay | Admitting: General Surgery

## 2012-03-30 VITALS — BP 122/76 | HR 88 | Temp 97.4°F | Resp 18 | Ht 71.0 in | Wt 244.2 lb

## 2012-03-30 DIAGNOSIS — Z09 Encounter for follow-up examination after completed treatment for conditions other than malignant neoplasm: Secondary | ICD-10-CM

## 2012-03-30 NOTE — Progress Notes (Signed)
Procedure: Status post open repair of umbilical hernia with mesh May 30th  History of Present Ilness: 36 year old Caucasian male comes in for his postoperative appointment. He states that he has been doing well. He denies any fevers or chills. He denies any nausea or vomiting. He reports a good appetite. He is no longer taking pain medication. He has some occasional soreness in his umbilicus.  Physical Exam: BP 122/76  Pulse 88  Temp 97.4 F (36.3 C) (Temporal)  Resp 18  Ht 5\' 11"  (1.803 m)  Wt 244 lb 3.2 oz (110.768 kg)  BMI 34.06 kg/m2  Gen: alert, NAD, non-toxic appearing Abd: soft, nontender, nondistended. Well-healed infraumbilical incision. No cellulitis. No incisional hernia Skin: no rash, no jaundice  Assessment and Plan: Status post open repair of umbilical hernia with mesh-doing well  I advised him he can take Motrin or ibuprofen for the intermittent discomfort. I reminded him he should not do any strenuous activity or heavy lifting until after July 15. Followup p.r.n.  [name]. Andrey Campanile, MD, FACS General, Bariatric, & Minimally Invasive Surgery [facility], [place]

## 2012-03-30 NOTE — Patient Instructions (Signed)
Can do light activity now. You can resume full activities after July 15 - until then avoid pushing, pulling, or lifting anything >15 pounds

## 2012-05-08 ENCOUNTER — Emergency Department (HOSPITAL_COMMUNITY)
Admission: EM | Admit: 2012-05-08 | Discharge: 2012-05-08 | Disposition: A | Discharge status: Home / Self Care | Payer: BC Managed Care – PPO | Attending: Emergency Medicine | Admitting: Emergency Medicine

## 2012-05-08 ENCOUNTER — Encounter (HOSPITAL_COMMUNITY): Payer: Self-pay | Admitting: *Deleted

## 2012-05-08 DIAGNOSIS — H6590 Unspecified nonsuppurative otitis media, unspecified ear: Secondary | ICD-10-CM

## 2012-05-08 DIAGNOSIS — Z87891 Personal history of nicotine dependence: Secondary | ICD-10-CM | POA: Insufficient documentation

## 2012-05-08 DIAGNOSIS — H748X9 Other specified disorders of middle ear and mastoid, unspecified ear: Secondary | ICD-10-CM | POA: Insufficient documentation

## 2012-05-08 MED ORDER — OXYMETAZOLINE HCL 0.05 % NA SOLN
1.0000 | Freq: Once | NASAL | Status: AC
  Administered 2012-05-08: 1 via NASAL
  Filled 2012-05-08: qty 15

## 2012-05-08 MED ORDER — PREDNISONE 10 MG PO TABS: 50.0000 mg | ORAL_TABLET | Freq: Every day | ORAL | Status: AC

## 2012-05-08 MED ORDER — PREDNISONE 20 MG PO TABS
60.0000 mg | ORAL_TABLET | ORAL | Status: AC
  Administered 2012-05-08: 60 mg via ORAL
  Filled 2012-05-08: qty 3

## 2012-05-08 NOTE — ED Notes (Signed)
Pt reports being sick x 3 weeks with cold symptoms, was diagnosed with right ear infection, took meds and now still having pain.

## 2012-05-08 NOTE — ED Provider Notes (Signed)
History    Scribed for Gerhard Munch, MD, the patient was seen in room TR02C/TR02C. This chart was scribed by Katha Cabal.   CSN: [id number]  Arrival date & time 05/08/12  1058   First MD Initiated Contact with Patient 05/08/12 1146      Chief Complaint  Patient presents with  . Otalgia    (Consider location/radiation/quality/duration/timing/severity/associated sxs/prior treatment) HPI Gerhard Munch, MD entered patient's room at 12:25 PM   Julian Cook is a 36 y.o. male who presents to the Emergency Department complaining of peristent right ear pain for 3 weeks.  Patient was seen at Urgent Care and prescribed ear drops and antibiotics .  Right ear feels full and painful.  Reports some intermittent diarrhea.  Symptoms are not associated with fevers, chills, cough, congestion, sore throat, chest pain, headache and abdominal pain. Patient reports having cold signs and symptoms for past 3 weeks that have resolved.  Patient has been taking Sudafed and Zyrtec daily without relief.  Patient has not taking steroids.       Past Medical History  Diagnosis Date  . Umbilical hernia 02/2012  . Headache     once/week  . Seasonal allergies   . Depression     no current meds.    Past Surgical History  Procedure Date  . No past surgeries   . Umbilical hernia repair 03/10/2012    Procedure: HERNIA REPAIR UMBILICAL ADULT;  Surgeon: Atilano Ina, MD,FACS;  Location: [facility];  Service: General;  Laterality: N/A;    History reviewed. No pertinent family history.  History  Substance Use Topics  . Smoking status: Former Games developer  . Smokeless tobacco: Never Used   Comment: quit smoking years ago  . Alcohol Use: Yes     ocassionally      Review of Systems  All other systems reviewed and are negative.  Remaining review of systems negative except as noted in the HPI.    Allergies  Fish allergy and Imipramine  Home Medications   Current Outpatient Rx    Name Route Sig Dispense Refill  . ACETAMINOPHEN ER 650 MG PO TBCR Oral Take 650 mg by mouth every 8 (eight) hours as needed.    . ASPIRIN-ACETAMINOPHEN-CAFFEINE 250-250-65 MG PO TABS Oral Take 1 tablet by mouth every 6 (six) hours as needed.    Marland Kitchen CETIRIZINE HCL 10 MG PO TABS Oral Take 10 mg by mouth daily.    . IBUPROFEN 200 MG PO TABS Oral Take 200 mg by mouth every 6 (six) hours as needed.    Marland Kitchen DIPHENHYDRAMINE HCL 25 MG PO TABS Oral Take 25 mg by mouth daily.      BP 115/83  Pulse 84  Temp 96.9 F (36.1 C) (Oral)  Resp 18  SpO2 97%  Physical Exam  Nursing note and vitals reviewed. Constitutional: He is oriented to person, place, and time. He appears well-developed. No distress.  HENT:  Head: Normocephalic and atraumatic.  Right Ear: A middle ear effusion is present.  Mouth/Throat: Oropharynx is clear and moist.  Eyes: Conjunctivae and EOM are normal.  Cardiovascular: Normal rate, regular rhythm and normal heart sounds.   Pulmonary/Chest: Effort normal and breath sounds normal. No stridor. No respiratory distress.  Abdominal: He exhibits no distension.  Musculoskeletal: He exhibits no edema.  Neurological: He is alert and oriented to person, place, and time.  Skin: Skin is warm and dry.  Psychiatric: He has a normal mood and affect.    ED  Course  Procedures (including critical care time)      COORDINATION OF CARE: 12:32 PM  Physical exam complete.  Will discharge patient with steroids.  Patient agrees with plan.      LABS / RADIOLOGY:   Labs Reviewed - No data to display No results found.       MDM             MEDICATIONS GIVEN IN THE E.D. Scheduled Meds:   Continuous Infusions:       IMPRESSION: 1. Middle ear effusion      NEW MEDICATIONS: New Prescriptions   No medications on file   I personally performed the services described in this documentation, which was scribed in my presence. The recorded information has been reviewed  and considered.  This generally well-appearing male presents with concerns of ongoing right ear discomfort.  On exam the patient is in no distress, is afebrile.  The patient has a right otitis medial with effusion.  The denial of any fever, other systemic complaints his reassuring for the low suspicion of acute arterial pathology.  Similarly, the patient has taken multiple antibiotics with no change in his condition, suggesting a viral or allergic process.  The lack of mastoid tenderness, no oral pharyngeal findings, is reassuring.  The patient was provided and he'll medication and started on steroids, discharged with return precautions, ENT followup.    Gerhard Munch, MD 05/08/12 1241

## 2012-08-12 ENCOUNTER — Emergency Department (HOSPITAL_COMMUNITY)
Admission: EM | Admit: 2012-08-12 | Discharge: 2012-08-12 | Disposition: A | Discharge status: Home / Self Care | Payer: BC Managed Care – PPO | Attending: Emergency Medicine | Admitting: Emergency Medicine

## 2012-08-12 DIAGNOSIS — Z87891 Personal history of nicotine dependence: Secondary | ICD-10-CM | POA: Insufficient documentation

## 2012-08-12 DIAGNOSIS — F3289 Other specified depressive episodes: Secondary | ICD-10-CM | POA: Insufficient documentation

## 2012-08-12 DIAGNOSIS — L259 Unspecified contact dermatitis, unspecified cause: Secondary | ICD-10-CM | POA: Insufficient documentation

## 2012-08-12 DIAGNOSIS — J309 Allergic rhinitis, unspecified: Secondary | ICD-10-CM | POA: Insufficient documentation

## 2012-08-12 DIAGNOSIS — Z79899 Other long term (current) drug therapy: Secondary | ICD-10-CM | POA: Insufficient documentation

## 2012-08-12 DIAGNOSIS — F329 Major depressive disorder, single episode, unspecified: Secondary | ICD-10-CM | POA: Insufficient documentation

## 2012-08-12 MED ORDER — DIPHENHYDRAMINE HCL 25 MG PO TABS: 25.0000 mg | ORAL_TABLET | Freq: Four times a day (QID) | ORAL | Status: DC | PRN

## 2012-08-12 MED ORDER — PREDNISONE 20 MG PO TABS: 40.0000 mg | ORAL_TABLET | Freq: Every day | ORAL | Status: DC

## 2012-08-12 MED ORDER — DIPHENHYDRAMINE HCL 25 MG PO CAPS
25.0000 mg | ORAL_CAPSULE | Freq: Once | ORAL | Status: AC
  Administered 2012-08-12: 25 mg via ORAL
  Filled 2012-08-12: qty 1

## 2012-08-12 NOTE — ED Provider Notes (Signed)
Medical screening examination/treatment/procedure(s) were performed by non-physician practitioner and as supervising physician I was immediately available for consultation/collaboration.   Benny Lennert, MD 08/12/12 [phone]

## 2012-08-12 NOTE — ED Notes (Signed)
Pt states rash on face is itchy. Pt has hydrocortisone cream, but it is not helping.

## 2012-08-12 NOTE — ED Notes (Signed)
Pt c/o rash on face for 2 days and has gotten worse. Pt states face feels puffy and wet. Pt has red areas to face which are spreading to ears. Pt states the rash is only on his face. Pt with no acute distress. Pt states he has a new dog at home and states the dog is in heat.

## 2012-08-12 NOTE — ED Provider Notes (Signed)
History     CSN: [id number]  Arrival date & time 08/12/12  1516   First MD Initiated Contact with Patient 08/12/12 1621      Chief Complaint  Patient presents with  . Rash    (Consider location/radiation/quality/duration/timing/severity/associated sxs/prior treatment) Patient is a 36 y.o. male presenting with rash. The history is provided by the patient, medical records and the spouse.  Rash     Julian Cook is a 36 y.o. male presents to the emergency department c/o rash to the face. Rash began gradually 2 days ago, has been persistent and gradually worsening. Pt has a new puppy who is in heat and pt thought that the rash might be from that.  Also, pt was helping in the dishroom at work and got splashed with the dirty dishwater.  Pt denies new laundry detergent, foods, lotions, cologne, meds.  Pt does take Zyrtec for seasonal allergies, multivitamin.  Pt has associated edema, erythema.  Pt states the rash feels puffy and wet; it also itches.  Pt has used hydrocortisone on his face without relief.  Pt denies fever, chills, headache, chest pain, shortness of breath, abdominal pain, nausea, vomiting, diarrhea.  The rash is no where else on his body.    Past Medical History  Diagnosis Date  . Umbilical hernia 02/2012  . Headache     once/week  . Seasonal allergies   . Depression     no current meds.    Past Surgical History  Procedure Date  . No past surgeries   . Umbilical hernia repair 03/10/2012    Procedure: HERNIA REPAIR UMBILICAL ADULT;  Surgeon: Atilano Ina, MD,FACS;  Location: [facility];  Service: General;  Laterality: N/A;    No family history on file.  History  Substance Use Topics  . Smoking status: Former Games developer  . Smokeless tobacco: Never Used   Comment: quit smoking years ago  . Alcohol Use: Yes     ocassionally      Review of Systems  Constitutional: Negative for fever, diaphoresis, appetite change, fatigue and unexpected weight  change.  HENT: Negative for mouth sores and neck stiffness.   Eyes: Negative for visual disturbance.  Respiratory: Negative for cough, chest tightness, shortness of breath and wheezing.   Cardiovascular: Negative for chest pain.  Gastrointestinal: Negative for nausea, vomiting, abdominal pain, diarrhea and constipation.  Genitourinary: Negative for dysuria, urgency, frequency and hematuria.  Skin: Positive for rash.  Neurological: Negative for syncope, light-headedness and headaches.  Psychiatric/Behavioral: Negative for disturbed wake/sleep cycle. The patient is not nervous/anxious.   All other systems reviewed and are negative.    Allergies  Fish allergy and Imipramine  Home Medications   Current Outpatient Rx  Name Route Sig Dispense Refill  . ACETAMINOPHEN ER 650 MG PO TBCR Oral Take 650 mg by mouth every 8 (eight) hours as needed.    Marland Kitchen CETIRIZINE HCL 10 MG PO TABS Oral Take 10 mg by mouth daily.    Marland Kitchen HYDROCORTISONE 1 % EX CREA Topical Apply 1 application topically 3 (three) times daily as needed. For facial rash.    . ADULT MULTIVITAMIN W/MINERALS CH Oral Take 1 tablet by mouth daily.    Marland Kitchen DIPHENHYDRAMINE HCL 25 MG PO TABS Oral Take 1 tablet (25 mg total) by mouth every 6 (six) hours as needed for itching (Rash). 30 tablet 0  . PREDNISONE 20 MG PO TABS Oral Take 2 tablets (40 mg total) by mouth daily. 10 tablet 0  BP 114/72  Pulse 95  Temp 98 F (36.7 C) (Oral)  Resp 20  SpO2 95%  Physical Exam  Nursing note and vitals reviewed. Constitutional: He appears well-developed and well-nourished. No distress.  HENT:  Head: Normocephalic and atraumatic.    Right Ear: Tympanic membrane, external ear and ear canal normal. No decreased hearing is noted.  Left Ear: Tympanic membrane, external ear and ear canal normal.  Nose: Nose normal.  Mouth/Throat: Uvula is midline, oropharynx is clear and moist and mucous membranes are normal. No oropharyngeal exudate.       No swelling  of the lips, uvula, pharynx  Eyes: Conjunctivae normal are normal. Pupils are equal, round, and reactive to light. No scleral icterus.  Neck: Normal range of motion. Neck supple.  Cardiovascular: Normal rate, regular rhythm, normal heart sounds and intact distal pulses.  Exam reveals no gallop and no friction rub.   No murmur heard. Pulmonary/Chest: Effort normal and breath sounds normal. No respiratory distress. He has no wheezes.  Musculoskeletal: Normal range of motion. He exhibits no edema.  Lymphadenopathy:    He has no cervical adenopathy.  Neurological: He is alert.       Speech is clear and goal oriented Moves extremities without ataxia  Skin: Skin is warm and dry. Rash (Edematous, mildly edematous) noted. He is not diaphoretic. There is erythema.       Rash without demarcated borders Weeping slightly   Psychiatric: He has a normal mood and affect.    ED Course  Procedures (including critical care time)  Labs Reviewed - No data to display No results found.   1. Contact dermatitis       MDM  Angela Cox presents with a rash to the left side of his face.  Rash appears to be a contact dermatitis.  No evidence of anaphylaxis, angioedema, pharyngeal edema. Patient without wheezing or shortness of breath.  We'll treat with Benadryl, steroids and have him continue to take his Zyrtec and use hydrocortisone 1% cream.  Also give dermatology referral.  Discussed the patient potential causes of the rash including sleeping in bed with dog.  They state understanding and agree with plan.  1. Medications: prednisone pack 2. Treatment: benadryl, hydrocortisone 1% 3x per day, continue zyrtec 3. Follow Up: with dermatology if no improvement by Monday         [facility], PA-C 08/12/12 1702

## 2013-03-29 ENCOUNTER — Emergency Department (HOSPITAL_COMMUNITY)
Admission: EM | Admit: 2013-03-29 | Discharge: 2013-03-29 | Disposition: A | Discharge status: Home / Self Care | Payer: BC Managed Care – PPO | Attending: Emergency Medicine | Admitting: Emergency Medicine

## 2013-03-29 ENCOUNTER — Encounter (HOSPITAL_COMMUNITY): Payer: Self-pay

## 2013-03-29 DIAGNOSIS — Z8719 Personal history of other diseases of the digestive system: Secondary | ICD-10-CM | POA: Insufficient documentation

## 2013-03-29 DIAGNOSIS — Z8669 Personal history of other diseases of the nervous system and sense organs: Secondary | ICD-10-CM | POA: Insufficient documentation

## 2013-03-29 DIAGNOSIS — L259 Unspecified contact dermatitis, unspecified cause: Secondary | ICD-10-CM

## 2013-03-29 DIAGNOSIS — L255 Unspecified contact dermatitis due to plants, except food: Secondary | ICD-10-CM | POA: Insufficient documentation

## 2013-03-29 DIAGNOSIS — Z8659 Personal history of other mental and behavioral disorders: Secondary | ICD-10-CM | POA: Insufficient documentation

## 2013-03-29 DIAGNOSIS — Z87891 Personal history of nicotine dependence: Secondary | ICD-10-CM | POA: Insufficient documentation

## 2013-03-29 MED ORDER — DEXAMETHASONE SODIUM PHOSPHATE 10 MG/ML IJ SOLN
10.0000 mg | Freq: Once | INTRAMUSCULAR | Status: AC
  Administered 2013-03-29: 10 mg via INTRAMUSCULAR
  Filled 2013-03-29: qty 1

## 2013-03-29 MED ORDER — TRIAMCINOLONE ACETONIDE 0.025 % EX OINT: TOPICAL_OINTMENT | Freq: Two times a day (BID) | CUTANEOUS | Status: DC

## 2013-03-29 MED ORDER — HYDROXYZINE HCL 25 MG PO TABS: 25.0000 mg | ORAL_TABLET | Freq: Four times a day (QID) | ORAL | Status: DC

## 2013-03-29 NOTE — ED Notes (Signed)
Pt presents with a rash that started about a month ago. Pt says he initially thought it was poison ivy and it started on his back. Per patient's fiance, she says the rash has spread from one spot to another. Pt has been using calamine lotion and gold bond lotion and nothing has cleared it up. Pt has not been seen for the rash.

## 2013-03-29 NOTE — ED Provider Notes (Signed)
History    This chart was scribed for non-physician practitioner, Arthor Captain, PA-C, working with Vida Roller, MD by Donne Anon, ED Scribe. This patient was seen in room WTR5/WTR5 and the patient's care was started at 2033.   CSN: [id number]  Arrival date & time 03/29/13  1903   First MD Initiated Contact with Patient 03/29/13 2033      Chief Complaint  Patient presents with  . Rash     The history is provided by the patient. No language interpreter was used.   HPI Comments: Julian Cook is a 37 y.o. male who presents to the Emergency Department complaining of 1 month of gradual onset, gradually worsening, intermittent rash described as itchy that originated on his back and has since spread to his arms, torso and legs. He has tried calamine lotion and Gold Bond lotion with little relief. He reports he has dogs at his house that could be bringing the poison ivy into his house. He denies facial or genital involvement or any other pain. He denies a hx of DM or HTN.   Past Medical History  Diagnosis Date  . Umbilical hernia 02/2012  . Headache(784.0)     once/week  . Seasonal allergies   . Depression     no current meds.    Past Surgical History  Procedure Laterality Date  . No past surgeries    . Umbilical hernia repair  03/10/2012    Procedure: HERNIA REPAIR UMBILICAL ADULT;  Surgeon: Atilano Ina, MD,FACS;  Location: [facility];  Service: General;  Laterality: N/A;    No family history on file.  History  Substance Use Topics  . Smoking status: Former Games developer  . Smokeless tobacco: Never Used     Comment: quit smoking years ago  . Alcohol Use: Yes     Comment: ocassionally      Review of Systems  Skin: Positive for rash.  All other systems reviewed and are negative.    Allergies  Fish allergy and Imipramine  Home Medications   Current Outpatient Rx  Name  Route  Sig  Dispense  Refill  . acetaminophen (TYLENOL) 650 MG CR tablet    Oral   Take 650 mg by mouth every 8 (eight) hours as needed (pain).          . cetirizine (ZYRTEC) 10 MG tablet   Oral   Take 10 mg by mouth daily.         . diphenhydrAMINE (BENADRYL) 25 MG tablet   Oral   Take 1 tablet (25 mg total) by mouth every 6 (six) hours as needed for itching (Rash).   30 tablet   0   . Multiple Vitamin (MULTIVITAMIN WITH MINERALS) TABS   Oral   Take 1 tablet by mouth daily.           BP 113/67  Pulse 95  Temp(Src) 97.6 F (36.4 C) (Oral)  Resp 18  SpO2 95%  Physical Exam  Nursing note and vitals reviewed. Constitutional: He appears well-developed and well-nourished. No distress.  HENT:  Head: Normocephalic and atraumatic.  Eyes: Conjunctivae are normal.  Neck: Neck supple. No tracheal deviation present.  Cardiovascular: Normal rate.   Pulmonary/Chest: Effort normal. No respiratory distress.  Musculoskeletal: Normal range of motion.  Neurological: He is alert.  Skin: Skin is warm and dry.  Vesicular eruption with weeping, itching along the medial surface of the arm consistent with contact dermatitis.   Psychiatric: He has a  normal mood and affect. His behavior is normal.    ED Course  Procedures (including critical care time) DIAGNOSTIC STUDIES: Oxygen Saturation is 95% on RA, adequate by my interpretation.    COORDINATION OF CARE: 9:56 PM Discussed treatment plan which includes a steroid shot and Aderax with pt at bedside and pt agreed to plan. Will give pt a note for work. Advised pt to either keep dogs outside or wash them thoroughly.    Labs Reviewed - No data to display No results found.   1. Contact dermatitis       MDM  Poison Ivy Pt presentation consistant with poison ivy infection. Discussed spread prevention& home care. DC with recommendation kenalog ointment.IM decadron.Strict return precautions discussed. Pt afebrile and in NAD prior to dc. Airway intact without compromise. No facial or genital involvement of  rash.  I personally performed the services described in this documentation, which was scribed in my presence. The recorded information has been reviewed and is accurate.         Arthor Captain, PA-C 03/30/13 1022

## 2013-03-29 NOTE — ED Notes (Signed)
Pt ambulatory to exam room with steady gait. Pt has red, splotchy, crusty rash to R arm. Pt has tried OTC creams, but not getting much better.

## 2013-03-30 NOTE — ED Provider Notes (Signed)
Medical screening examination/treatment/procedure(s) were performed by non-physician practitioner and as supervising physician I was immediately available for consultation/collaboration.    Vida Roller, MD 03/30/13 [phone]

## 2013-04-25 ENCOUNTER — Emergency Department (HOSPITAL_COMMUNITY)
Admission: EM | Admit: 2013-04-25 | Discharge: 2013-04-25 | Disposition: A | Discharge status: Home / Self Care | Payer: BC Managed Care – PPO | Attending: Emergency Medicine | Admitting: Emergency Medicine

## 2013-04-25 ENCOUNTER — Encounter (HOSPITAL_COMMUNITY): Payer: Self-pay | Admitting: Emergency Medicine

## 2013-04-25 ENCOUNTER — Emergency Department (HOSPITAL_COMMUNITY): Payer: BC Managed Care – PPO

## 2013-04-25 DIAGNOSIS — Z8719 Personal history of other diseases of the digestive system: Secondary | ICD-10-CM | POA: Insufficient documentation

## 2013-04-25 DIAGNOSIS — Y92009 Unspecified place in unspecified non-institutional (private) residence as the place of occurrence of the external cause: Secondary | ICD-10-CM | POA: Insufficient documentation

## 2013-04-25 DIAGNOSIS — Y93E2 Activity, laundry: Secondary | ICD-10-CM | POA: Insufficient documentation

## 2013-04-25 DIAGNOSIS — R296 Repeated falls: Secondary | ICD-10-CM | POA: Insufficient documentation

## 2013-04-25 DIAGNOSIS — R209 Unspecified disturbances of skin sensation: Secondary | ICD-10-CM | POA: Insufficient documentation

## 2013-04-25 DIAGNOSIS — J309 Allergic rhinitis, unspecified: Secondary | ICD-10-CM | POA: Insufficient documentation

## 2013-04-25 DIAGNOSIS — Z87891 Personal history of nicotine dependence: Secondary | ICD-10-CM | POA: Insufficient documentation

## 2013-04-25 DIAGNOSIS — Z888 Allergy status to other drugs, medicaments and biological substances status: Secondary | ICD-10-CM | POA: Insufficient documentation

## 2013-04-25 DIAGNOSIS — R202 Paresthesia of skin: Secondary | ICD-10-CM

## 2013-04-25 DIAGNOSIS — W860XXA Exposure to domestic wiring and appliances, initial encounter: Secondary | ICD-10-CM | POA: Insufficient documentation

## 2013-04-25 DIAGNOSIS — Y998 Other external cause status: Secondary | ICD-10-CM | POA: Insufficient documentation

## 2013-04-25 DIAGNOSIS — Z8669 Personal history of other diseases of the nervous system and sense organs: Secondary | ICD-10-CM | POA: Insufficient documentation

## 2013-04-25 DIAGNOSIS — Z79899 Other long term (current) drug therapy: Secondary | ICD-10-CM | POA: Insufficient documentation

## 2013-04-25 DIAGNOSIS — F341 Dysthymic disorder: Secondary | ICD-10-CM | POA: Insufficient documentation

## 2013-04-25 DIAGNOSIS — R32 Unspecified urinary incontinence: Secondary | ICD-10-CM | POA: Insufficient documentation

## 2013-04-25 DIAGNOSIS — T754XXA Electrocution, initial encounter: Secondary | ICD-10-CM

## 2013-04-25 HISTORY — DX: Anxiety disorder, unspecified: F41.9

## 2013-04-25 LAB — CBC
MCH: 32.3 pg (ref 26.0–34.0)
MCHC: 34.1 g/dL (ref 30.0–36.0)
MCV: 94.7 fL (ref 78.0–100.0)
Platelets: 293 10*3/uL (ref 150–400)
RBC: 4.74 MIL/uL (ref 4.22–5.81)
RDW: 12.8 % (ref 11.5–15.5)

## 2013-04-25 LAB — PRO B NATRIURETIC PEPTIDE: Pro B Natriuretic peptide (BNP): <5 pg/mL (ref 0–125)

## 2013-04-25 LAB — BASIC METABOLIC PANEL
CO2: 27 mEq/L (ref 19–32)
Calcium: 9.6 mg/dL (ref 8.4–10.5)
Creatinine, Ser: 0.77 mg/dL (ref 0.50–1.35)
GFR calc non Af Amer: >90 mL/min (ref >90)

## 2013-04-25 LAB — CK TOTAL AND CKMB (NOT AT ARMC): CK, MB: 2 ng/mL (ref 0.3–4.0)

## 2013-04-25 MED ORDER — SODIUM CHLORIDE 0.9 % IV BOLUS (SEPSIS)
1000.0000 mL | Freq: Once | INTRAVENOUS | Status: AC
Start: 2013-04-25 — End: 2013-04-25
  Administered 2013-04-25: 1000 mL via INTRAVENOUS
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
## 2014-02-07 ENCOUNTER — Emergency Department (HOSPITAL_COMMUNITY): Payer: BC Managed Care – PPO

## 2014-02-07 ENCOUNTER — Emergency Department (HOSPITAL_COMMUNITY)
Admission: EM | Admit: 2014-02-07 | Discharge: 2014-02-07 | Disposition: A | Discharge status: Home / Self Care | Payer: BC Managed Care – PPO | Attending: Emergency Medicine | Admitting: Emergency Medicine

## 2014-02-07 ENCOUNTER — Encounter (HOSPITAL_COMMUNITY): Payer: Self-pay | Admitting: Emergency Medicine

## 2014-02-07 DIAGNOSIS — Y9389 Activity, other specified: Secondary | ICD-10-CM | POA: Diagnosis not present

## 2014-02-07 DIAGNOSIS — Z79899 Other long term (current) drug therapy: Secondary | ICD-10-CM | POA: Insufficient documentation

## 2014-02-07 DIAGNOSIS — S79919A Unspecified injury of unspecified hip, initial encounter: Secondary | ICD-10-CM | POA: Insufficient documentation

## 2014-02-07 DIAGNOSIS — S79929A Unspecified injury of unspecified thigh, initial encounter: Principal | ICD-10-CM

## 2014-02-07 DIAGNOSIS — Z8719 Personal history of other diseases of the digestive system: Secondary | ICD-10-CM | POA: Diagnosis not present

## 2014-02-07 DIAGNOSIS — Y9289 Other specified places as the place of occurrence of the external cause: Secondary | ICD-10-CM | POA: Diagnosis not present

## 2014-02-07 DIAGNOSIS — Z8659 Personal history of other mental and behavioral disorders: Secondary | ICD-10-CM | POA: Insufficient documentation

## 2014-02-07 DIAGNOSIS — M7918 Myalgia, other site: Secondary | ICD-10-CM

## 2014-02-07 DIAGNOSIS — S8990XA Unspecified injury of unspecified lower leg, initial encounter: Secondary | ICD-10-CM | POA: Insufficient documentation

## 2014-02-07 DIAGNOSIS — Z87891 Personal history of nicotine dependence: Secondary | ICD-10-CM | POA: Diagnosis not present

## 2014-02-07 DIAGNOSIS — S99919A Unspecified injury of unspecified ankle, initial encounter: Secondary | ICD-10-CM

## 2014-02-07 DIAGNOSIS — S99929A Unspecified injury of unspecified foot, initial encounter: Secondary | ICD-10-CM

## 2014-02-07 MED ORDER — IBUPROFEN 400 MG PO TABS
600.0000 mg | ORAL_TABLET | Freq: Once | ORAL | Status: AC
  Administered 2014-02-07: 600 mg via ORAL
  Filled 2014-02-07 (×2): qty 1

## 2014-02-07 MED ORDER — HYDROCODONE-ACETAMINOPHEN 5-325 MG PO TABS: 1.0000 | ORAL_TABLET | Freq: Four times a day (QID) | ORAL | Status: DC | PRN
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
## 2014-02-07 NOTE — Discharge Instructions (Signed)
Musculoskeletal Pain °Musculoskeletal pain is muscle and boney aches and pains. These pains can occur in any part of the body. Your caregiver may treat you without knowing the cause of the pain. They may treat you if blood or urine tests, X-rays, and other tests were normal.  °CAUSES °There is often not a definite cause or reason for these pains. These pains may be caused by a type of germ (virus). The discomfort may also come from overuse. Overuse includes working out too hard when your body is not fit. Boney aches also come from weather changes. Bone is sensitive to atmospheric pressure changes. °HOME CARE INSTRUCTIONS  °· Ask when your test results will be ready. Make sure you get your test results. °· Only take over-the-counter or prescription medicines for pain, discomfort, or fever as directed by your caregiver. If you were given medications for your condition, do not drive, operate machinery or power tools, or sign legal documents for 24 hours. Do not drink alcohol. Do not take sleeping pills or other medications that may interfere with treatment. °· Continue all activities unless the activities cause more pain. When the pain lessens, slowly resume normal activities. Gradually increase the intensity and duration of the activities or exercise. °· During periods of severe pain, bed rest may be helpful. Lay or sit in any position that is comfortable. °· Putting ice on the injured area. °· Put ice in a bag. °· Place a towel between your skin and the bag. °· Leave the ice on for 15 to 20 minutes, 3 to 4 times a day. °· Follow up with your caregiver for continued problems and no reason can be found for the pain. If the pain becomes worse or does not go away, it may be necessary to repeat tests or do additional testing. Your caregiver may need to look further for a possible cause. °SEEK IMMEDIATE MEDICAL CARE IF: °· You have pain that is getting worse and is not relieved by medications. °· You develop chest pain  that is associated with shortness or breath, sweating, feeling sick to your stomach (nauseous), or throw up (vomit). °· Your pain becomes localized to the abdomen. °· You develop any new symptoms that seem different or that concern you. °MAKE SURE YOU:  °· Understand these instructions. °· Will watch your condition. °· Will get help right away if you are not doing well or get worse. °Document Released: 09/28/2005 Document Revised: 12/21/2011 Document Reviewed: 06/02/2013 °[employer]® Patient Information ©2014 [employer]. ° °
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
## 2014-02-17 NOTE — ED Provider Notes (Signed)
I saw and evaluated the patient, reviewed the resident's note and I agree with the findings and plan.   EKG Interpretation None      Patient examined face-to-face. History reviewed. Examine the patient with Dr. Jennye MoccasinSterling. I agree with his assessment and plan. I have a reviewed and agree with his note. I agree with disposition.  Rolland PorterMark Gavyn Ybarra, MD 02/17/14 1921

## 2014-09-20 ENCOUNTER — Other Ambulatory Visit (INDEPENDENT_AMBULATORY_CARE_PROVIDER_SITE_OTHER): Payer: 59

## 2014-09-20 ENCOUNTER — Ambulatory Visit (INDEPENDENT_AMBULATORY_CARE_PROVIDER_SITE_OTHER): Payer: 59 | Admitting: Family

## 2014-09-20 ENCOUNTER — Encounter: Payer: Self-pay | Admitting: Family

## 2014-09-20 VITALS — BP 120/86 | HR 87 | Temp 97.7°F | Resp 18 | Ht 69.0 in | Wt 249.4 lb

## 2014-09-20 DIAGNOSIS — Z0000 Encounter for general adult medical examination without abnormal findings: Secondary | ICD-10-CM

## 2014-09-20 LAB — LIPID PANEL
CHOLESTEROL: 169 mg/dL (ref 0–200)
HDL: 38.5 mg/dL — ABNORMAL LOW (ref >39.00)
LDL CALC: 100 mg/dL — AB (ref 0–99)
NONHDL: 130.5
Total CHOL/HDL Ratio: 4
Triglycerides: 154 mg/dL — ABNORMAL HIGH (ref 0.0–149.0)
VLDL: 30.8 mg/dL (ref 0.0–40.0)

## 2014-09-20 LAB — HEMOGLOBIN A1C: HEMOGLOBIN A1C: 6 % (ref 4.6–6.5)

## 2014-09-20 NOTE — Progress Notes (Signed)
Pre visit review using our clinic review tool, if applicable. No additional management support is needed unless otherwise documented below in the visit note. 

## 2014-09-20 NOTE — Patient Instructions (Addendum)
Thank you for choosing [facility].  Summary/Instructions:  Your prescription(s) have been submitted to your pharmacy. Please take as directed and contact our office if you believe you are having problem(s) with the medication(s).  Please stop by the lab on the basement level of the building for your blood work. Your results will be released to MyChart (or called to you) after review, usually within 72hours after test completion. If any changes need to be made, you will be notified at that same time.  Please stop by radiology on the basement level of the building for your x-rays. Your results will be released to MyChart (or called to you) after review, usually within 72hours after test completion. If any changes need to be made, you will be notified at that same time.  Referrals have been made during this visit. You should expect to hear back from our schedulers in about 7-10 days in regards to establishing an appointment with the specialists we discussed.   If your symptoms worsen or fail to improve, please contact our office for further instruction, or in case of emergency go directly to the emergency room at the closest medical facility.   Health Maintenance A healthy lifestyle and preventative care can promote health and wellness.  Maintain regular health, dental, and eye exams.  Eat a healthy diet. Foods like vegetables, fruits, whole grains, low-fat dairy products, and lean protein foods contain the nutrients you need and are low in calories. Decrease your intake of foods high in solid fats, added sugars, and salt. Get information about a proper diet from your health care provider, if necessary.  Regular physical exercise is one of the most important things you can do for your health. Most adults should get at least 150 minutes of moderate-intensity exercise (any activity that increases your heart rate and causes you to sweat) each week. In addition, most adults need  muscle-strengthening exercises on 2 or more days a week.   Maintain a healthy weight. The body mass index (BMI) is a screening tool to identify possible weight problems. It provides an estimate of body fat based on height and weight. Your health care provider can find your BMI and can help you achieve or maintain a healthy weight. For males 20 years and older:  A BMI below 18.5 is considered underweight.  A BMI of 18.5 to 24.9 is normal.  A BMI of 25 to 29.9 is considered overweight.  A BMI of 30 and above is considered obese.  Maintain normal blood lipids and cholesterol by exercising and minimizing your intake of saturated fat. Eat a balanced diet with plenty of fruits and vegetables. Blood tests for lipids and cholesterol should begin at age 39 and be repeated every 5 years. If your lipid or cholesterol levels are high, you are over age 39, or you are at high risk for heart disease, you may need your cholesterol levels checked more frequently.Ongoing high lipid and cholesterol levels should be treated with medicines if diet and exercise are not working.  If you smoke, find out from your health care provider how to quit. If you do not use tobacco, do not start.  Lung cancer screening is recommended for adults aged 55-80 years who are at high risk for developing lung cancer because of a history of smoking. A yearly low-dose CT scan of the lungs is recommended for people who have at least a 30-pack-year history of smoking and are current smokers or have quit within the past 15 years.  A pack year of smoking is smoking an average of 1 pack of cigarettes a day for 1 year (for example, a 30-pack-year history of smoking could mean smoking 1 pack a day for 30 years or 2 packs a day for 15 years). Yearly screening should continue until the smoker has stopped smoking for at least 15 years. Yearly screening should be stopped for people who develop a health problem that would prevent them from having lung  cancer treatment.  If you choose to drink alcohol, do not have more than 2 drinks per day. One drink is considered to be 12 oz (360 mL) of beer, 5 oz (150 mL) of wine, or 1.5 oz (45 mL) of liquor.  Avoid the use of street drugs. Do not share needles with anyone. Ask for help if you need support or instructions about stopping the use of drugs.  High blood pressure causes heart disease and increases the risk of stroke. Blood pressure should be checked at least every 1-2 years. Ongoing high blood pressure should be treated with medicines if weight loss and exercise are not effective.  If you are 8245-39 years old, ask your health care provider if you should take aspirin to prevent heart disease.  Diabetes screening involves taking a blood sample to check your fasting blood sugar level. This should be done once every 3 years after age 10445 if you are at a normal weight and without risk factors for diabetes. Testing should be considered at a younger age or be carried out more frequently if you are overweight and have at least 1 risk factor for diabetes.  Colorectal cancer can be detected and often prevented. Most routine colorectal cancer screening begins at the age of 39 and continues through age 39. However, your health care provider may recommend screening at an earlier age if you have risk factors for colon cancer. On a yearly basis, your health care provider may provide home test kits to check for hidden blood in the stool. A small camera at the end of a tube may be used to directly examine the colon (sigmoidoscopy or colonoscopy) to detect the earliest forms of colorectal cancer. Talk to your health care provider about this at age 39 when routine screening begins. A direct exam of the colon should be repeated every 5-10 years through age 39, unless early forms of precancerous polyps or small growths are found.  People who are at an increased risk for hepatitis B should be screened for this virus. You are  considered at high risk for hepatitis B if:  You were born in a country where hepatitis B occurs often. Talk with your health care provider about which countries are considered high risk.  Your parents were born in a high-risk country and you have not received a shot to protect against hepatitis B (hepatitis B vaccine).  You have HIV or AIDS.  You use needles to inject street drugs.  You live with, or have sex with, someone who has hepatitis B.  You are a man who has sex with other men (MSM).  You get hemodialysis treatment.  You take certain medicines for conditions like cancer, organ transplantation, and autoimmune conditions.  Hepatitis C blood testing is recommended for all people born from [id number] through 1966 and any individual with known risk factors for hepatitis C.  Healthy men should no longer receive prostate-specific antigen (PSA) blood tests as part of routine cancer screening. Talk to your health care provider about prostate cancer screening.  Testicular cancer screening is not recommended for adolescents or adult males who have no symptoms. Screening includes self-exam, a health care provider exam, and other screening tests. Consult with your health care provider about any symptoms you have or any concerns you have about testicular cancer.  Practice safe sex. Use condoms and avoid high-risk sexual practices to reduce the spread of sexually transmitted infections (STIs).  You should be screened for STIs, including gonorrhea and chlamydia if:  You are sexually active and are younger than 24 years.  You are older than 24 years, and your health care provider tells you that you are at risk for this type of infection.  Your sexual activity has changed since you were last screened, and you are at an increased risk for chlamydia or gonorrhea. Ask your health care provider if you are at risk.  If you are at risk of being infected with HIV, it is recommended that you take a  prescription medicine daily to prevent HIV infection. This is called pre-exposure prophylaxis (PrEP). You are considered at risk if:  You are a man who has sex with other men (MSM).  You are a heterosexual man who is sexually active with multiple partners.  You take drugs by injection.  You are sexually active with a partner who has HIV.  Talk with your health care provider about whether you are at high risk of being infected with HIV. If you choose to begin PrEP, you should first be tested for HIV. You should then be tested every 3 months for as long as you are taking PrEP.  Use sunscreen. Apply sunscreen liberally and repeatedly throughout the day. You should seek shade when your shadow is shorter than you. Protect yourself by wearing long sleeves, pants, a wide-brimmed hat, and sunglasses year round whenever you are outdoors.  Tell your health care provider of new moles or changes in moles, especially if there is a change in shape or color. Also, tell your health care provider if a mole is larger than the size of a pencil eraser.  A one-time screening for abdominal aortic aneurysm (AAA) and surgical repair of large AAAs by ultrasound is recommended for men aged 65-75 years who are current or former smokers.  Stay current with your vaccines (immunizations). Document Released: 03/26/2008 Document Revised: 10/03/2013 Document Reviewed: 02/23/2011 [facility] Patient Information 2015 [place], [place]. This information is not intended to replace advice given to you by your health care provider. Make sure you discuss any questions you have with your health care provider.

## 2014-09-20 NOTE — Assessment & Plan Note (Signed)
1) Anticipatory Guidance: Discussed importance of wearing a seatbelt while driving and not texting while driving; changing batteries in smoke detector at least once annually; wearing suntan lotion when outside; eating a balanced and moderate diet; getting physical activity at least 30 minutes per day.  2) Immunizations / Screenings / Labs:  All immunizations are up-to-date her recommendations. Patient is due for a dental exam. All other screenings are up-to-date per recommendations. Obtain A1c, lipid profile and nicotine metabolites.  Overall well exam. Patient's risk factors include obesity and sedentary lifestyle. Discussed with patient importance of eating a balanced and varied diet increasing physical activity at 30 minutes most days of the week. Goal for him will be to lose 5-10% of his current body weight. Continue other healthy lifestyle choices. Follow up prevention exam in one year.
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
## 2014-09-21 ENCOUNTER — Telehealth: Payer: Self-pay | Admitting: Family

## 2014-09-21 LAB — NICOTINE/COTININE METABOLITES

## 2014-09-21 NOTE — Telephone Encounter (Signed)
Please call and inform the patient his lab results are mostly within the normal limits. His cholesterol level is borderline being high. There is no need for medication at this time, however I recommend increasing fruits and vegetables while decreasing saturated fats. Also recommend increasing physical activity to 30 minutes most days a week. His nicotine test came back as negative. We'll fax the results to his employer per his request.

## 2014-09-24 NOTE — Telephone Encounter (Signed)
Let pt know that labs were normal. As well as to eat better and try to exercise due to borderline high cholesterol. Also let him know that his form for his job was already faxed over. Pt understood.

## 2014-09-24 NOTE — Telephone Encounter (Signed)
Called pt and left message for him to call back

## 2014-12-14 ENCOUNTER — Emergency Department (HOSPITAL_COMMUNITY)
Admission: EM | Admit: 2014-12-14 | Discharge: 2014-12-14 | Disposition: A | Discharge status: Home / Self Care | Payer: Self-pay | Attending: Emergency Medicine | Admitting: Emergency Medicine

## 2014-12-14 ENCOUNTER — Encounter (HOSPITAL_COMMUNITY): Payer: Self-pay | Admitting: Emergency Medicine

## 2014-12-14 DIAGNOSIS — Y999 Unspecified external cause status: Secondary | ICD-10-CM | POA: Insufficient documentation

## 2014-12-14 DIAGNOSIS — Y939 Activity, unspecified: Secondary | ICD-10-CM | POA: Insufficient documentation

## 2014-12-14 DIAGNOSIS — S199XXA Unspecified injury of neck, initial encounter: Secondary | ICD-10-CM | POA: Insufficient documentation

## 2014-12-14 DIAGNOSIS — Z8659 Personal history of other mental and behavioral disorders: Secondary | ICD-10-CM | POA: Insufficient documentation

## 2014-12-14 DIAGNOSIS — S3992XA Unspecified injury of lower back, initial encounter: Secondary | ICD-10-CM | POA: Insufficient documentation

## 2014-12-14 DIAGNOSIS — M542 Cervicalgia: Secondary | ICD-10-CM

## 2014-12-14 DIAGNOSIS — M549 Dorsalgia, unspecified: Secondary | ICD-10-CM

## 2014-12-14 DIAGNOSIS — Z79899 Other long term (current) drug therapy: Secondary | ICD-10-CM | POA: Insufficient documentation

## 2014-12-14 DIAGNOSIS — Y92410 Unspecified street and highway as the place of occurrence of the external cause: Secondary | ICD-10-CM | POA: Insufficient documentation

## 2014-12-14 DIAGNOSIS — Z8719 Personal history of other diseases of the digestive system: Secondary | ICD-10-CM | POA: Insufficient documentation

## 2014-12-14 DIAGNOSIS — Z87891 Personal history of nicotine dependence: Secondary | ICD-10-CM | POA: Insufficient documentation

## 2014-12-14 MED ORDER — IBUPROFEN 800 MG PO TABS: 800.0000 mg | ORAL_TABLET | Freq: Three times a day (TID) | ORAL | Status: DC

## 2014-12-14 MED ORDER — METHOCARBAMOL 500 MG PO TABS: 500.0000 mg | ORAL_TABLET | Freq: Two times a day (BID) | ORAL | Status: DC
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
## 2014-12-25 ENCOUNTER — Encounter (HOSPITAL_COMMUNITY): Payer: Self-pay | Admitting: Emergency Medicine

## 2014-12-25 ENCOUNTER — Emergency Department (HOSPITAL_COMMUNITY)
Admission: EM | Admit: 2014-12-25 | Discharge: 2014-12-25 | Disposition: A | Discharge status: Home / Self Care | Payer: Self-pay | Attending: Emergency Medicine | Admitting: Emergency Medicine

## 2014-12-25 DIAGNOSIS — R0602 Shortness of breath: Secondary | ICD-10-CM | POA: Insufficient documentation

## 2014-12-25 DIAGNOSIS — Z8659 Personal history of other mental and behavioral disorders: Secondary | ICD-10-CM | POA: Insufficient documentation

## 2014-12-25 DIAGNOSIS — R21 Rash and other nonspecific skin eruption: Secondary | ICD-10-CM | POA: Insufficient documentation

## 2014-12-25 DIAGNOSIS — Z87891 Personal history of nicotine dependence: Secondary | ICD-10-CM | POA: Insufficient documentation

## 2014-12-25 DIAGNOSIS — Z7952 Long term (current) use of systemic steroids: Secondary | ICD-10-CM | POA: Insufficient documentation

## 2014-12-25 DIAGNOSIS — T7803XA Anaphylactic reaction due to other fish, initial encounter: Secondary | ICD-10-CM | POA: Insufficient documentation

## 2014-12-25 DIAGNOSIS — T7840XA Allergy, unspecified, initial encounter: Secondary | ICD-10-CM

## 2014-12-25 DIAGNOSIS — Z79899 Other long term (current) drug therapy: Secondary | ICD-10-CM | POA: Insufficient documentation

## 2014-12-25 DIAGNOSIS — Z8719 Personal history of other diseases of the digestive system: Secondary | ICD-10-CM | POA: Insufficient documentation

## 2014-12-25 HISTORY — DX: Low back pain, unspecified: M54.50

## 2014-12-25 HISTORY — DX: Low back pain: M54.5

## 2014-12-25 MED ORDER — EPINEPHRINE 0.3 MG/0.3ML IJ SOAJ
0.3000 mg | Freq: Once | INTRAMUSCULAR | Status: AC
  Administered 2014-12-25: 0.3 mg via INTRAMUSCULAR
  Filled 2014-12-25: qty 0.3

## 2014-12-25 MED ORDER — PREDNISONE 20 MG PO TABS: ORAL_TABLET | ORAL | Status: DC

## 2014-12-25 MED ORDER — SODIUM CHLORIDE 0.9 % IV BOLUS (SEPSIS): 1000.0000 mL | Freq: Once | INTRAVENOUS | Status: DC

## 2014-12-25 NOTE — Progress Notes (Signed)
ED CM consulted by EDP to assist pt with getting an Epipen. Cm spoke with ED RN charge nurse who confirmed pt can not be given an epipen Cm spoke with pt to attempt to qualify him for epipen coupon He is not eligible because he does not have coverage per website.  Cm unable to find a epipen discount medication card (Cm office and peers consulted) EDP updated 1425 Cm spoke with Olegario Messierkathy pf [facility] to inquire about assistance with epipen. Olegario MessierKathy informed CM [facility] after making an appointment with a [facility] pcp. States at that time she could fill out a pt assistance form for pt but epipens are not keep at [facility].  States this process would take 1-2 months to obtain approval for epipen to be sent to [facility] for pt if he is a candidate. Olegario MessierKathy confirms Dr Jeanine Luzgregory Calone is not a [facility] MD .  informed CM he does not have money to go to [facility] for appt.  CM discussed [facility] card program [facility] Pt agreed to a [facility] to [facility] and confirmed his address & # as being correct in Epic 1431 Dr [phone] saw pt x 1 on 09/20/2014 per office staff and has not seen pt since.  Updated office staff that pt recently lost his job and no longer has insurance coverage.   1452 Cm x left pt a voice message after confirm with EDP & ED RN that pt left AMA but was given an epipen prior to living Cm informed pt cm had spoken with Dr Carver Filaalone at [facility](Shana) who agreed to assist with calling in an epipen for him and to offer a epipen card to get a free epipen from [facility] CM spoke with staff at the allergy center who states epipen cards only provided for the  allergy office patients Left Cm contact number for pt if other questions  1458 Cm updated Shana at [facility] and thanked her for her assistance

## 2014-12-25 NOTE — ED Notes (Signed)
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 While serving fish today he became flush, carries epi pen and given dose at 1215 by school nurse.  BP 102/70 HR 87 Sats 100%. Given benadryl and zantac by EMS. No SOB noted pt is in NAD.

## 2014-12-25 NOTE — ED Provider Notes (Signed)
CSN: [id number]     Arrival date & time 12/25/14  1301 History   First MD Initiated Contact with Patient 12/25/14 1303     Chief Complaint  Patient presents with  . Allergic Reaction     (Consider location/radiation/quality/duration/timing/severity/associated sxs/prior Treatment) HPI Comments: Patient presents to the ER for evaluation of allergic reaction. Patient reports a history of allergic reaction to shellfish and fish. He reports that his allergic reaction to shellfish as anaphylaxis, usually only gets a rash from regular fish. 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 He reports that this is not unusual when he is around fish, did not think much about it, but then started having facial swelling, shortness of breath. He administered his EpiPen. EMS responded and administered Benadryl and Zantac. At arrival to the ER he is symptom-free. His blood pressure has been low according to EMS and at arrival to the ER. Patient denies chest pain. He is not having any shortness of breath.  Patient is a 39 y.o. male presenting with allergic reaction.  Allergic Reaction Presenting symptoms: rash     Past Medical History  Diagnosis Date  . Umbilical hernia 02/2012  . Headache(784.0)     once/week  . Seasonal allergies   . Depression     no current meds.  . Anxiety   . Low back pain    Past Surgical History  Procedure Laterality Date  . No past surgeries    . Umbilical hernia repair  03/10/2012    Procedure: HERNIA REPAIR UMBILICAL ADULT;  Surgeon: Atilano Ina, MD,FACS;  Location: [facility];  Service: General;  Laterality: N/A;   Family History  Problem Relation Age of Onset  . Multiple sclerosis Mother   . Seizures Mother    History  Substance Use Topics  . Smoking status: Former Smoker -- 0.50 packs/day for 1 years    Types: Cigarettes  . Smokeless tobacco: Never Used     Comment: quit smoking years ago  . Alcohol  Use: No    Review of Systems  Respiratory: Positive for shortness of breath.   Skin: Positive for rash.  All other systems reviewed and are negative.     Allergies  Fish allergy; Shellfish allergy; and Imipramine  Home Medications   Prior to Admission medications   Medication Sig Start Date End Date Taking? Authorizing Provider  cetirizine (ZYRTEC) 10 MG tablet Take 10 mg by mouth daily.   Yes Historical Provider, MD  EPINEPHrine 0.3 mg/0.3 mL IJ SOAJ injection Inject 0.3 mg into the muscle daily as needed (for allergic reactions).   Yes Historical Provider, MD  ibuprofen (ADVIL,MOTRIN) 800 MG tablet Take 1 tablet (800 mg total) by mouth 3 (three) times daily. Patient taking differently: Take 800 mg by mouth every 8 (eight) hours as needed (for pain.).  12/14/14  Yes Elson Areas, PA-C  Multiple Vitamin (MULTIVITAMIN WITH MINERALS) TABS Take 1 tablet by mouth daily.   Yes Historical Provider, MD  methocarbamol (ROBAXIN) 500 MG tablet Take 1 tablet (500 mg total) by mouth 2 (two) times daily. Patient not taking: Reported on 12/25/2014 12/14/14   Elson Areas, PA-C  predniSONE (DELTASONE) 20 MG tablet 3 tabs po daily x 3 days, then 2 tabs x 3 days, then 1.5 tabs x 3 days, then 1 tab x 3 days, then 0.5 tabs x 3 days 12/25/14   Gilda Crease, MD   BP  103/68 mmHg  Temp(Src) 98.5 F (36.9 C) (Oral)  Resp 15 Physical Exam  Constitutional: He is oriented to person, place, and time. He appears well-developed and well-nourished. No distress.  HENT:  Head: Normocephalic and atraumatic.  Right Ear: Hearing normal.  Left Ear: Hearing normal.  Nose: Nose normal.  Mouth/Throat: Oropharynx is clear and moist and mucous membranes are normal.  Eyes: Conjunctivae and EOM are normal. Pupils are equal, round, and reactive to light.  Neck: Normal range of motion. Neck supple.  Cardiovascular: Regular rhythm, S1 normal and S2 normal.  Exam reveals no gallop and no friction rub.   No murmur  heard. Pulmonary/Chest: Effort normal and breath sounds normal. No respiratory distress. He exhibits no tenderness.  Abdominal: Soft. Normal appearance and bowel sounds are normal. There is no hepatosplenomegaly. There is no tenderness. There is no rebound, no guarding, no tenderness at McBurney's point and negative Murphy's sign. No hernia.  Musculoskeletal: Normal range of motion.  Neurological: He is alert and oriented to person, place, and time. He has normal strength. No cranial nerve deficit or sensory deficit. Coordination normal. GCS eye subscore is 4. GCS verbal subscore is 5. GCS motor subscore is 6.  Skin: Skin is warm, dry and intact. No rash noted. There is erythema (face). No cyanosis.  Psychiatric: He has a normal mood and affect. His speech is normal and behavior is normal. Thought content normal.  Nursing note and vitals reviewed.   ED Course  Procedures (including critical care time) Labs Review Labs Reviewed - No data to display  Imaging Review No results found.   EKG Interpretation None      MDM   Final diagnoses:  Allergic reaction, initial encounter    Patient presents to the ER for evaluation of allergic reaction to fish. Patient self-administered epinephrine prior to arrival with improvement of symptoms. He was noted to be slightly hypotensive at arrival to the ER, but is asymptomatic. Patient had been given Benadryl and Zantac prior to arrival. I recommended observation in the ER with IV fluids, but patient has declined. He reports that he has social engagements later today that he needs to attend. He understands that he could become hypotensive, have recurrent allergic reaction symptoms. This could lead to permanent disability or even death. Patient will sign out AGAINST MEDICAL ADVICE. Patient appears to have the capacity to make this decision at this time. He will be given a prescription for prednisone and an EpiPen.    Gilda Creasehristopher J Pollina, MD 12/25/14  1410

## 2014-12-25 NOTE — Discharge Instructions (Signed)
Take Benadryl 2 tablets every 6 hours for the next 2 days, then as needed.  Seafood Allergy  Seafood allergies are usually a life-long problem. People are usually only allergic to one seafood group. Seafood allergy does not increase the risk of iodine allergy. Some conditions (scombroid fish poisoning and [place] allergy) may seem like allergic reactions to seafood, but are separate conditions. Bad reactions may also occur after eating seafood infected or tainted by algae-derived neurotoxins (ciguatera and paralytic shellfish poisoning). SYMPTOMS  Many allergic reactions to food are mild. Mild symptoms may be limited to hives or swelling in one area. The most dangerous symptoms are:  Breathing difficulties. This may occur from breathing in seafood allergen fumes when food is being cooked or in seafood processing factories.  A drop in blood pressure (shock).  Anaphylaxis is a severe whole body reaction. This is the most severe form of allergic reaction. Other symptoms include:   Swelling of the face or throat.  Dizziness.  Difficulty thinking.  Intense sense of fear.  Tightness in the chest.  Vomiting.  Diarrhea. TYPES OF SEAFOOD There are many types of seafood. The major groups of sea life that trigger allergic reactions are:  VERTEBRATES  Scaly fish (salmon, cod, mackerel, sardines, herring, anchovies, tuna, trout, haddock, [place]).  INVERTEBRATES  Crustaceans (prawns/shrimps, lobster, crab, crayfish, yabbies).  Mollusks.  Shellfish (clams, mussels, oysters, scallops).  Cephalopods (octopus, cuttlefish, squid, calamari).  Gastropods (sea slugs, garden slugs, snails). As a rule, patients allergic to one group of seafood can usually tolerate those from another. Seafood allergy is most common in communities where seafood is an important part of the diet, such as [place] and [place]. Sensitivity is more common in adults than children.  Occasionally, intense cooking  will partially or completely destroy the triggering allergen. This may explain why some patients allergic to fresh fish are able to tolerate salmon or tuna in a can. AVOIDING THE ALLERGEN IS AN IMPORTANT PART OF MANAGEMENT. Complete avoidance of one or more groups of seafood is often advised. It may be difficult to achieve in practice. Accidental exposure is more likely to occur when eating away from home. This is most true when eating at seafood restaurants. OTHER POTENTIAL SOURCES OF ACCIDENTAL EXPOSURE AND CROSS-CONTAMINATION INCLUDE:  Seafood platters (best avoided).  Asian foods in which shellfish can be a common ingredient or contaminant (prawns in fried rice or soups).  Food may be rolled in the same batter or cooked in the same oil as seafood (take-out fish and chips).  Anchovies (fish) in Caesar salads and as an ingredient, or Worcestershire sauce.  Contaminated barbecues.  Fish extracts are also occasionally used to remove particulate matter from some beverages such as wine and beer. This process is called "fining." SEAFOOD ALLERGY AND IODINE ALLERGY ARE UNRELATED. Even though seafood is a rich source of natural iodine, allergic reactions to seafood proteins have a different mechanism to that of iodine. Iodine can be found in topical antiseptics and x-ray contrast agents. Patients allergic to seafood are not at an increased risk of allergic reactions to iodine. Those with iodine allergy are not at increased risk of seafood allergy. SEEK IMMEDIATE MEDICAL CARE IF:  You have difficulty breathing, or you are wheezing or have a tight feeling in your chest or throat.  You have a swollen mouth, or have hives, swelling or itching over your body.  You feel faint or pass out.  You develop chest pain or a worsening of the problems which originally  caused you to seek medical help. If you have eaten seafood and develop problems or symptoms that seem unusual for you, seek advice from your  caregiver. If the problems are severe, call your local emergency medical service. Document Released: 03/20/2002 Document Revised: 12/21/2011 Document Reviewed: 01/12/2014 [facility] Patient Information 2015 [place], [place]. This information is not intended to replace advice given to you by your health care provider. Make sure you discuss any questions you have with your health care provider. Anaphylactic Reaction An anaphylactic reaction is a sudden, severe allergic reaction that involves the whole body. It can be life threatening. A hospital stay is often required. People with asthma, eczema, or hay fever are slightly more likely to have an anaphylactic reaction. CAUSES  An anaphylactic reaction may be caused by anything to which you are allergic. After being exposed to the allergic substance, your immune system becomes sensitized to it. When you are exposed to that allergic substance again, an allergic reaction can occur. Common causes of an anaphylactic reaction include:  Medicines.  Foods, especially peanuts, wheat, shellfish, milk, and eggs.  Insect bites or stings.  Blood products.  Chemicals, such as dyes, latex, and contrast material used for imaging tests. SYMPTOMS  When an allergic reaction occurs, the body releases histamine and other substances. These substances cause symptoms such as tightening of the airway. Symptoms often develop within seconds or minutes of exposure. Symptoms may include:  Skin rash or hives.  Itching.  Chest tightness.  Swelling of the eyes, tongue, or lips.  Trouble breathing or swallowing.  Lightheadedness or fainting.  Anxiety or confusion.  Stomach pains, vomiting, or diarrhea.  Nasal congestion.  A fast or irregular heartbeat (palpitations). DIAGNOSIS  Diagnosis is based on your history of recent exposure to allergic substances, your symptoms, and a physical exam. Your caregiver may also perform blood or urine tests to confirm the  diagnosis. TREATMENT  Epinephrine medicine is the main treatment for an anaphylactic reaction. Other medicines that may be used for treatment include antihistamines, steroids, and albuterol. In severe cases, fluids and medicine to support blood pressure may be given through an intravenous line (IV). Even if you improve after treatment, you need to be observed to make sure your condition does not get worse. This may require a stay in the hospital. [place] a medical alert bracelet or necklace stating your allergy.  You and your family must learn how to use an anaphylaxis kit or give an epinephrine injection to temporarily treat an emergency allergic reaction. Always carry your epinephrine injection or anaphylaxis kit with you. This can be lifesaving if you have a severe reaction.  Do not drive or perform tasks after treatment until the medicines used to treat your reaction have worn off, or until your caregiver says it is okay.  If you have hives or a rash:  Take medicines as directed by your caregiver.  You may use an over-the-counter antihistamine (diphenhydramine) as needed.  Apply cold compresses to the skin or take baths in cool water. Avoid hot baths or showers. SEEK MEDICAL CARE IF:   You develop symptoms of an allergic reaction to a new substance. Symptoms may start right away or minutes later.  You develop a rash, hives, or itching.  You develop new symptoms. SEEK IMMEDIATE MEDICAL CARE IF:   You have swelling of the mouth, difficulty breathing, or wheezing.  You have a tight feeling in your chest or throat.  You develop hives, swelling, or itching  all over your body.  You develop severe vomiting or diarrhea.  You feel faint or pass out. This is an emergency. Use your epinephrine injection or anaphylaxis kit as you have been instructed. Call your local emergency services (911 in [place] Even if you improve after the injection, you need to be examined at  a hospital emergency department. MAKE SURE YOU:   Understand these instructions.  Will watch your condition.  Will get help right away if you are not doing well or get worse. Document Released: 09/28/2005 Document Revised: 10/03/2013 Document Reviewed: 12/30/2011 [facility] Patient Information 2015 [place], [place]. This information is not intended to replace advice given to you by your health care provider. Make sure you discuss any questions you have with your health care provider.

## 2015-07-31 ENCOUNTER — Encounter: Payer: Self-pay | Admitting: Family

## 2015-07-31 ENCOUNTER — Telehealth: Payer: Self-pay | Admitting: Family

## 2015-07-31 ENCOUNTER — Encounter (INDEPENDENT_AMBULATORY_CARE_PROVIDER_SITE_OTHER): Payer: Self-pay

## 2015-07-31 NOTE — Telephone Encounter (Signed)
Pt called request to know what his blood type is or can we help him determine what is his blood type? Pt just had a baby and they want to know the father's blood type. Please call him back

## 2015-08-01 NOTE — Telephone Encounter (Signed)
Tried calling pt to let him know his blood type results. The number listed did not work. Tried 3 times and it said it was not accurate. Called emergency contact and phone was cut off and went straight to VM.

## 2015-08-08 ENCOUNTER — Ambulatory Visit (INDEPENDENT_AMBULATORY_CARE_PROVIDER_SITE_OTHER): Payer: 59 | Admitting: Family

## 2015-08-08 ENCOUNTER — Encounter: Payer: Self-pay | Admitting: Family

## 2015-08-08 ENCOUNTER — Other Ambulatory Visit (INDEPENDENT_AMBULATORY_CARE_PROVIDER_SITE_OTHER): Payer: 59

## 2015-08-08 VITALS — BP 110/80 | HR 92 | Temp 98.0°F | Ht 70.0 in | Wt 252.2 lb

## 2015-08-08 DIAGNOSIS — Z0000 Encounter for general adult medical examination without abnormal findings: Secondary | ICD-10-CM

## 2015-08-08 DIAGNOSIS — R7989 Other specified abnormal findings of blood chemistry: Secondary | ICD-10-CM | POA: Diagnosis not present

## 2015-08-08 DIAGNOSIS — E781 Pure hyperglyceridemia: Secondary | ICD-10-CM

## 2015-08-08 LAB — LIPID PANEL
Cholesterol: 174 mg/dL (ref 0–200)
HDL: 39.1 mg/dL (ref >39.00)
NONHDL: 135.13
Total CHOL/HDL Ratio: 4
Triglycerides: 321 mg/dL — ABNORMAL HIGH (ref 0.0–149.0)
VLDL: 64.2 mg/dL — ABNORMAL HIGH (ref 0.0–40.0)

## 2015-08-08 LAB — COMPREHENSIVE METABOLIC PANEL
ALK PHOS: 46 U/L (ref 39–117)
ALT: 34 U/L (ref 0–53)
AST: 21 U/L (ref 0–37)
Albumin: 3.7 g/dL (ref 3.5–5.2)
BUN: 25 mg/dL — AB (ref 6–23)
CHLORIDE: 105 meq/L (ref 96–112)
CO2: 26 meq/L (ref 19–32)
Calcium: 9.4 mg/dL (ref 8.4–10.5)
Creatinine, Ser: 1.29 mg/dL (ref 0.40–1.50)
GFR: 65.54 mL/min (ref >60.00)
GLUCOSE: 93 mg/dL (ref 70–99)
POTASSIUM: 4.7 meq/L (ref 3.5–5.1)
SODIUM: 140 meq/L (ref 135–145)
TOTAL PROTEIN: 6.6 g/dL (ref 6.0–8.3)
Total Bilirubin: 0.4 mg/dL (ref 0.2–1.2)

## 2015-08-08 LAB — CBC
HEMATOCRIT: 44.5 % (ref 39.0–52.0)
HEMOGLOBIN: 15.1 g/dL (ref 13.0–17.0)
MCHC: 33.9 g/dL (ref 30.0–36.0)
MCV: 94.7 fl (ref 78.0–100.0)
PLATELETS: 267 10*3/uL (ref 150.0–400.0)
RBC: 4.7 Mil/uL (ref 4.22–5.81)
RDW: 12.8 % (ref 11.5–15.5)
WBC: 7.2 10*3/uL (ref 4.0–10.5)

## 2015-08-08 LAB — LDL CHOLESTEROL, DIRECT: Direct LDL: 107 mg/dL

## 2015-08-08 LAB — TSH: TSH: 1.3 u[IU]/mL (ref 0.35–4.50)

## 2015-08-08 MED ORDER — FENOFIBRATE 48 MG PO TABS: 48.0000 mg | ORAL_TABLET | Freq: Every day | ORAL | Status: DC

## 2015-08-08 MED ORDER — EPINEPHRINE 0.3 MG/0.3ML IJ SOAJ: 0.3000 mg | Freq: Every day | INTRAMUSCULAR | Status: DC | PRN

## 2015-08-08 NOTE — Progress Notes (Signed)
Subjective:    Patient ID: Julian Cook, male    DOB: [birth year], 39 y.o.   MRN: [id number]  Chief Complaint  Patient presents with  . Annual Exam    HPI:  Julian Cook is a 39 y.o. male who presents today for an annual wellness visit.   1) Health Maintenance -   Diet - Averages about 2-3 meals per day with snacks consisting of chicken, fruits, vegetables, some dairy; 2-3 cups of caffeine daily  Exercise - 1-2 times per week consisting of cardio and resistance training   2) Preventative Exams / Immunizations:  Dental -- Due for exam  Vision -- Up to date   Health Maintenance  Topic Date Due  . HIV Screening  07/10/1990  . INFLUENZA VACCINE  05/12/2016  . TETANUS/TDAP  08/16/2022     There is no immunization history on file for this patient.  Allergies  Allergen Reactions  . Fish Allergy Anaphylaxis  . Shellfish Allergy Shortness Of Breath and Swelling  . Imipramine Rash     Outpatient Prescriptions Prior to Visit  Medication Sig Dispense Refill  . cetirizine (ZYRTEC) 10 MG tablet Take 10 mg by mouth daily.    Marland Kitchen ibuprofen (ADVIL,MOTRIN) 800 MG tablet Take 1 tablet (800 mg total) by mouth 3 (three) times daily. (Patient taking differently: Take 800 mg by mouth every 8 (eight) hours as needed (for pain.). ) 21 tablet 0  . Multiple Vitamin (MULTIVITAMIN WITH MINERALS) TABS Take 1 tablet by mouth daily.    Marland Kitchen EPINEPHrine 0.3 mg/0.3 mL IJ SOAJ injection Inject 0.3 mg into the muscle daily as needed (for allergic reactions).    . methocarbamol (ROBAXIN) 500 MG tablet Take 1 tablet (500 mg total) by mouth 2 (two) times daily. (Patient not taking: Reported on 12/25/2014) 20 tablet 0  . predniSONE (DELTASONE) 20 MG tablet 3 tabs po daily x 3 days, then 2 tabs x 3 days, then 1.5 tabs x 3 days, then 1 tab x 3 days, then 0.5 tabs x 3 days 27 tablet 0   No facility-administered medications prior to visit.     Past Medical History  Diagnosis Date  . Umbilical hernia  02/2012  . Headache(784.0)     once/week  . Seasonal allergies   . Depression     no current meds.  . Anxiety   . Low back pain      Past Surgical History  Procedure Laterality Date  . Umbilical hernia repair  03/10/2012    Procedure: HERNIA REPAIR UMBILICAL ADULT;  Surgeon: Atilano Ina, MD,FACS;  Location: [facility];  Service: General;  Laterality: N/A;     Family History  Problem Relation Age of Onset  . Multiple sclerosis Mother   . Seizures Mother      Social History   Social History  . Marital Status: Single    Spouse Name: N/A  . Number of Children: 1  . Years of Education: 12   Occupational History  . Cook    Social History Main Topics  . Smoking status: Former Smoker -- 0.50 packs/day for 1 years    Types: Cigarettes  . Smokeless tobacco: Never Used     Comment: quit smoking years ago  . Alcohol Use: No  . Drug Use: No  . Sexual Activity: Not on file   Other Topics Concern  . Not on file   Social History Narrative   Born and raised [place], [place], grew up in [place]  and then back to [place]. Fun: Sherri RadHang out with dogs. Recently had his first.   2 dogs   Denies religious beliefs effecting health care.    Allergies  Allergen Reactions  . Fish Allergy Anaphylaxis  . Shellfish Allergy Shortness Of Breath and Swelling  . Imipramine Rash     Outpatient Prescriptions Prior to Visit  Medication Sig Dispense Refill  . cetirizine (ZYRTEC) 10 MG tablet Take 10 mg by mouth daily.    Marland Kitchen. ibuprofen (ADVIL,MOTRIN) 800 MG tablet Take 1 tablet (800 mg total) by mouth 3 (three) times daily. (Patient taking differently: Take 800 mg by mouth every 8 (eight) hours as needed (for pain.). ) 21 tablet 0  . Multiple Vitamin (MULTIVITAMIN WITH MINERALS) TABS Take 1 tablet by mouth daily.    Marland Kitchen. EPINEPHrine 0.3 mg/0.3 mL IJ SOAJ injection Inject 0.3 mg into the muscle daily as needed (for allergic reactions).    . methocarbamol (ROBAXIN) 500 MG tablet Take 1 tablet (500  mg total) by mouth 2 (two) times daily. (Patient not taking: Reported on 12/25/2014) 20 tablet 0  . predniSONE (DELTASONE) 20 MG tablet 3 tabs po daily x 3 days, then 2 tabs x 3 days, then 1.5 tabs x 3 days, then 1 tab x 3 days, then 0.5 tabs x 3 days 27 tablet 0   No facility-administered medications prior to visit.     Past Medical History  Diagnosis Date  . Umbilical hernia 02/2012  . Headache(784.0)     once/week  . Seasonal allergies   . Depression     no current meds.  . Anxiety   . Low back pain      Past Surgical History  Procedure Laterality Date  . Umbilical hernia repair  03/10/2012    Procedure: HERNIA REPAIR UMBILICAL ADULT;  Surgeon: Atilano InaEric M Wilson, MD,FACS;  Location: [facility];  Service: General;  Laterality: N/A;     Family History  Problem Relation Age of Onset  . Multiple sclerosis Mother   . Seizures Mother      Social History   Social History  . Marital Status: Single    Spouse Name: N/A  . Number of Children: 1  . Years of Education: 12   Occupational History  . Cook    Social History Main Topics  . Smoking status: Former Smoker -- 0.50 packs/day for 1 years    Types: Cigarettes  . Smokeless tobacco: Never Used     Comment: quit smoking years ago  . Alcohol Use: No  . Drug Use: No  . Sexual Activity: Not on file   Other Topics Concern  . Not on file   Social History Narrative   [place], grew up in [place] and then back to [place]. Fun: Sherri RadHang out with dogs. Recently had his first.   2 dogs   Denies religious beliefs effecting health care.      Review of Systems  Constitutional: Denies fever, chills, fatigue, or significant weight gain/loss. HENT: Head: Denies headache or neck pain Ears: Denies changes in hearing, ringing in ears, earache, drainage Nose: Denies discharge, stuffiness, itching, nosebleed, sinus pain Throat: Denies sore throat, hoarseness, dry mouth, sores, thrush Eyes: Denies loss/changes  in vision, pain, redness, blurry/double vision, flashing lights Cardiovascular: Denies chest pain/discomfort, tightness, palpitations, shortness of breath with activity, difficulty lying down, swelling, sudden awakening with shortness of breath Respiratory: Denies shortness of breath, cough, sputum production, wheezing Gastrointestinal: Denies dysphasia, heartburn, change in appetite,  nausea, change in bowel habits, rectal bleeding, constipation, diarrhea, yellow skin or eyes Genitourinary: Denies frequency, urgency, burning/pain, blood in urine, incontinence, change in urinary strength. Musculoskeletal: Denies muscle/joint pain, stiffness, back pain, redness or swelling of joints, trauma Skin: Denies rashes, lumps, itching, dryness, color changes, or hair/nail changes Neurological: Denies dizziness, fainting, seizures, weakness, numbness, tingling, tremor Psychiatric - Denies nervousness, stress, depression or memory loss Endocrine: Denies heat or cold intolerance, sweating, frequent urination, excessive thirst, changes in appetite Hematologic: Denies ease of bruising or bleeding     Objective:     BP 110/80 mmHg  Pulse 92  Temp(Src) 98 F (36.7 C) (Oral)  Ht  (1.778 m)  Wt 252 lb 4 oz (114.42 kg)  BMI 36.19 kg/m2  SpO2 98% Nursing note and vital signs reviewed.  Physical Exam  Constitutional: He is oriented to person, place, and time. He appears well-developed and well-nourished.  HENT:  Head: Normocephalic.  Right Ear: Hearing, tympanic membrane, external ear and ear canal normal.  Left Ear: Hearing, tympanic membrane, external ear and ear canal normal.  Nose: Nose normal.  Mouth/Throat: Uvula is midline, oropharynx is clear and moist and mucous membranes are normal.  Eyes: Conjunctivae and EOM are normal. Pupils are equal, round, and reactive to light.  Neck: Neck supple. No JVD present. No tracheal deviation present. No thyromegaly present.  Cardiovascular: Normal rate,  regular rhythm, normal heart sounds and intact distal pulses.   Pulmonary/Chest: Effort normal and breath sounds normal.  Abdominal: Soft. Bowel sounds are normal. He exhibits no distension and no mass. There is no tenderness. There is no rebound and no guarding.  Musculoskeletal: Normal range of motion. He exhibits no edema or tenderness.  Lymphadenopathy:    He has no cervical adenopathy.  Neurological: He is alert and oriented to person, place, and time. He has normal reflexes. No cranial nerve deficit. He exhibits normal muscle tone. Coordination normal.  Skin: Skin is warm and dry.  Psychiatric: He has a normal mood and affect. His behavior is normal. Judgment and thought content normal.       Assessment & Plan:   Problem List Items Addressed This Visit      Other   Routine general medical examination at a health care facility - Primary    1) Anticipatory Guidance: Discussed importance of wearing a seatbelt while driving and not texting while driving; changing batteries in smoke detector at least once annually; wearing suntan lotion when outside; eating a balanced and moderate diet; getting physical activity at least 30 minutes per day.  2) Immunizations / Screenings / Labs:  All immunizations are up-to-date per recommendations. Due for a dental visit which will be scheduled independently. All other screenings are up-to-date per recommendations. Obtain CBC, BMET, Lipid profile and TSH.   Overall well exam. Risk factors for cardiovascular disease include obesity and lack of physical activity. Recommend and encouraged increased physical activity to 30 minutes daily and improving nutrient density and overall eating patterns. Recommend weight loss of approximately 5-10% of current body weight. Continue healthy lifestyle behaviors. Follow-up prevention exam in one year. Follow-up office visit pending blood work.       Relevant Orders   Comprehensive metabolic panel   CBC   Lipid panel    TSH

## 2015-08-08 NOTE — Assessment & Plan Note (Addendum)
1) Anticipatory Guidance: Discussed importance of wearing a seatbelt while driving and not texting while driving; changing batteries in smoke detector at least once annually; wearing suntan lotion when outside; eating a balanced and moderate diet; getting physical activity at least 30 minutes per day.  2) Immunizations / Screenings / Labs:  All immunizations are up-to-date per recommendations. Due for a dental visit which will be scheduled independently. All other screenings are up-to-date per recommendations. Obtain CBC, BMET, Lipid profile and TSH.   Overall well exam. Risk factors for cardiovascular disease include obesity and lack of physical activity. Recommend and encouraged increased physical activity to 30 minutes daily and improving nutrient density and overall eating patterns. Recommend weight loss of approximately 5-10% of current body weight. Continue healthy lifestyle behaviors. Follow-up prevention exam in one year. Follow-up office visit pending blood work.

## 2015-08-08 NOTE — Patient Instructions (Addendum)
Thank you for choosing [facility].  Summary/Instructions:  Please stop by the lab on the basement level of the building for your blood work. Your results will be released to MyChart (or called to you) after review, usually within 72 hours after test completion. If any changes need to be made, you will be notified at that same time.  If your symptoms worsen or fail to improve, please contact our office for further instruction, or in case of emergency go directly to the emergency room at the closest medical facility.    Health Maintenance, Male A healthy lifestyle and preventative care can promote health and wellness.  Maintain regular health, dental, and eye exams.  Eat a healthy diet. Foods like vegetables, fruits, whole grains, low-fat dairy products, and lean protein foods contain the nutrients you need and are low in calories. Decrease your intake of foods high in solid fats, added sugars, and salt. Get information about a proper diet from your health care provider, if necessary.  Regular physical exercise is one of the most important things you can do for your health. Most adults should get at least 150 minutes of moderate-intensity exercise (any activity that increases your heart rate and causes you to sweat) each week. In addition, most adults need muscle-strengthening exercises on 2 or more days a week.   Maintain a healthy weight. The body mass index (BMI) is a screening tool to identify possible weight problems. It provides an estimate of body fat based on height and weight. Your health care provider can find your BMI and can help you achieve or maintain a healthy weight. For males 20 years and older:  A BMI below 18.5 is considered underweight.  A BMI of 18.5 to 24.9 is normal.  A BMI of 25 to 29.9 is considered overweight.  A BMI of 30 and above is considered obese.  Maintain normal blood lipids and cholesterol by exercising and minimizing your intake of saturated fat.  Eat a balanced diet with plenty of fruits and vegetables. Blood tests for lipids and cholesterol should begin at age 20 and be repeated every 5 years. If your lipid or cholesterol levels are high, you are over age 50, or you are at high risk for heart disease, you may need your cholesterol levels checked more frequently.Ongoing high lipid and cholesterol levels should be treated with medicines if diet and exercise are not working.  If you smoke, find out from your health care provider how to quit. If you do not use tobacco, do not start.  Lung cancer screening is recommended for adults aged 55-80 years who are at high risk for developing lung cancer because of a history of smoking. A yearly low-dose CT scan of the lungs is recommended for people who have at least a 30-pack-year history of smoking and are current smokers or have quit within the past 15 years. A pack year of smoking is smoking an average of 1 pack of cigarettes a day for 1 year (for example, a 30-pack-year history of smoking could mean smoking 1 pack a day for 30 years or 2 packs a day for 15 years). Yearly screening should continue until the smoker has stopped smoking for at least 15 years. Yearly screening should be stopped for people who develop a health problem that would prevent them from having lung cancer treatment.  If you choose to drink alcohol, do not have more than 2 drinks per day. One drink is considered to be 12 oz (360 mL)   of beer, 5 oz (150 mL) of wine, or 1.5 oz (45 mL) of liquor.  Avoid the use of street drugs. Do not share needles with anyone. Ask for help if you need support or instructions about stopping the use of drugs.  High blood pressure causes heart disease and increases the risk of stroke. High blood pressure is more likely to develop in:  People who have blood pressure in the end of the normal range (100-139/85-89 mm Hg).  People who are overweight or obese.  People who are African American.  If you are  18-39 years of age, have your blood pressure checked every 3-5 years. If you are 40 years of age or older, have your blood pressure checked every year. You should have your blood pressure measured twice--once when you are at a hospital or clinic, and once when you are not at a hospital or clinic. Record the average of the two measurements. To check your blood pressure when you are not at a hospital or clinic, you can use:  An automated blood pressure machine at a pharmacy.  A home blood pressure monitor.  If you are 45-79 years old, ask your health care provider if you should take aspirin to prevent heart disease.  Diabetes screening involves taking a blood sample to check your fasting blood sugar level. This should be done once every 3 years after age 45 if you are at a normal weight and without risk factors for diabetes. Testing should be considered at a younger age or be carried out more frequently if you are overweight and have at least 1 risk factor for diabetes.  Colorectal cancer can be detected and often prevented. Most routine colorectal cancer screening begins at the age of 50 and continues through age 75. However, your health care provider may recommend screening at an earlier age if you have risk factors for colon cancer. On a yearly basis, your health care provider may provide home test kits to check for hidden blood in the stool. A small camera at the end of a tube may be used to directly examine the colon (sigmoidoscopy or colonoscopy) to detect the earliest forms of colorectal cancer. Talk to your health care provider about this at age 50 when routine screening begins. A direct exam of the colon should be repeated every 5-10 years through age 75, unless early forms of precancerous polyps or small growths are found.  People who are at an increased risk for hepatitis B should be screened for this virus. You are considered at high risk for hepatitis B if:  You were born in a country where  hepatitis B occurs often. Talk with your health care provider about which countries are considered high risk.  Your parents were born in a high-risk country and you have not received a shot to protect against hepatitis B (hepatitis B vaccine).  You have HIV or AIDS.  You use needles to inject street drugs.  You live with, or have sex with, someone who has hepatitis B.  You are a man who has sex with other men (MSM).  You get hemodialysis treatment.  You take certain medicines for conditions like cancer, organ transplantation, and autoimmune conditions.  Hepatitis C blood testing is recommended for all people born from 1945 through 1965 and any individual with known risk factors for hepatitis C.  Healthy men should no longer receive prostate-specific antigen (PSA) blood tests as part of routine cancer screening. Talk to your health care provider about   prostate cancer screening.  Testicular cancer screening is not recommended for adolescents or adult males who have no symptoms. Screening includes self-exam, a health care provider exam, and other screening tests. Consult with your health care provider about any symptoms you have or any concerns you have about testicular cancer.  Practice safe sex. Use condoms and avoid high-risk sexual practices to reduce the spread of sexually transmitted infections (STIs).  You should be screened for STIs, including gonorrhea and chlamydia if:  You are sexually active and are younger than 24 years.  You are older than 24 years, and your health care provider tells you that you are at risk for this type of infection.  Your sexual activity has changed since you were last screened, and you are at an increased risk for chlamydia or gonorrhea. Ask your health care provider if you are at risk.  If you are at risk of being infected with HIV, it is recommended that you take a prescription medicine daily to prevent HIV infection. This is called pre-exposure  prophylaxis (PrEP). You are considered at risk if:  You are a man who has sex with other men (MSM).  You are a heterosexual man who is sexually active with multiple partners.  You take drugs by injection.  You are sexually active with a partner who has HIV.  Talk with your health care provider about whether you are at high risk of being infected with HIV. If you choose to begin PrEP, you should first be tested for HIV. You should then be tested every 3 months for as long as you are taking PrEP.  Use sunscreen. Apply sunscreen liberally and repeatedly throughout the day. You should seek shade when your shadow is shorter than you. Protect yourself by wearing long sleeves, pants, a wide-brimmed hat, and sunglasses year round whenever you are outdoors.  Tell your health care provider of new moles or changes in moles, especially if there is a change in shape or color. Also, tell your health care provider if a mole is larger than the size of a pencil eraser.  A one-time screening for abdominal aortic aneurysm (AAA) and surgical repair of large AAAs by ultrasound is recommended for men aged 65-75 years who are current or former smokers.  Stay current with your vaccines (immunizations).   This information is not intended to replace advice given to you by your health care provider. Make sure you discuss any questions you have with your health care provider.   Document Released: 03/26/2008 Document Revised: 10/19/2014 Document Reviewed: 02/23/2011 Elsevier Interactive Patient Education 2016 [employer]  

## 2015-09-03 ENCOUNTER — Emergency Department (HOSPITAL_COMMUNITY): Payer: 59

## 2015-09-03 ENCOUNTER — Emergency Department (HOSPITAL_COMMUNITY)
Admission: EM | Admit: 2015-09-03 | Discharge: 2015-09-03 | Disposition: A | Discharge status: Home / Self Care | Payer: 59 | Attending: Emergency Medicine | Admitting: Emergency Medicine

## 2015-09-03 ENCOUNTER — Encounter (HOSPITAL_COMMUNITY): Payer: Self-pay | Admitting: Emergency Medicine

## 2015-09-03 DIAGNOSIS — R42 Dizziness and giddiness: Secondary | ICD-10-CM | POA: Insufficient documentation

## 2015-09-03 DIAGNOSIS — Z8719 Personal history of other diseases of the digestive system: Secondary | ICD-10-CM | POA: Diagnosis not present

## 2015-09-03 DIAGNOSIS — R110 Nausea: Secondary | ICD-10-CM | POA: Insufficient documentation

## 2015-09-03 DIAGNOSIS — Z8659 Personal history of other mental and behavioral disorders: Secondary | ICD-10-CM | POA: Insufficient documentation

## 2015-09-03 DIAGNOSIS — R0602 Shortness of breath: Secondary | ICD-10-CM | POA: Diagnosis not present

## 2015-09-03 DIAGNOSIS — Z87891 Personal history of nicotine dependence: Secondary | ICD-10-CM | POA: Insufficient documentation

## 2015-09-03 DIAGNOSIS — R61 Generalized hyperhidrosis: Secondary | ICD-10-CM | POA: Diagnosis not present

## 2015-09-03 DIAGNOSIS — R079 Chest pain, unspecified: Secondary | ICD-10-CM | POA: Diagnosis not present

## 2015-09-03 DIAGNOSIS — Z79899 Other long term (current) drug therapy: Secondary | ICD-10-CM | POA: Insufficient documentation

## 2015-09-03 LAB — BASIC METABOLIC PANEL
Anion gap: 10 (ref 5–15)
BUN: 16 mg/dL (ref 6–20)
CALCIUM: 9.5 mg/dL (ref 8.9–10.3)
CO2: 22 mmol/L (ref 22–32)
Chloride: 106 mmol/L (ref 101–111)
Creatinine, Ser: 0.93 mg/dL (ref 0.61–1.24)
GFR calc Af Amer: >60 mL/min (ref >60)
GLUCOSE: 103 mg/dL — AB (ref 65–99)
POTASSIUM: 4.1 mmol/L (ref 3.5–5.1)
Sodium: 138 mmol/L (ref 135–145)

## 2015-09-03 LAB — CBC
HEMATOCRIT: 43.3 % (ref 39.0–52.0)
Hemoglobin: 14.9 g/dL (ref 13.0–17.0)
MCH: 32.7 pg (ref 26.0–34.0)
MCHC: 34.4 g/dL (ref 30.0–36.0)
MCV: 95 fL (ref 78.0–100.0)
Platelets: 236 10*3/uL (ref 150–400)
RBC: 4.56 MIL/uL (ref 4.22–5.81)
RDW: 12.6 % (ref 11.5–15.5)
WBC: 9.2 10*3/uL (ref 4.0–10.5)

## 2015-09-03 LAB — D-DIMER, QUANTITATIVE: D-Dimer, Quant: <0.27 ug/mL-FEU (ref 0.00–0.50)

## 2015-09-03 LAB — I-STAT TROPONIN, ED
Troponin i, poc: 0 ng/mL (ref 0.00–0.08)
Troponin i, poc: 0 ng/mL (ref 0.00–0.08)

## 2015-09-03 MED ORDER — ASPIRIN 325 MG PO TABS
325.0000 mg | ORAL_TABLET | Freq: Every day | ORAL | Status: DC
  Administered 2015-09-03: 325 mg via ORAL
  Filled 2015-09-03: qty 1

## 2015-09-03 NOTE — ED Provider Notes (Signed)
CSN: [id number]     Arrival date & time 09/03/15  1642 History   First MD Initiated Contact with Patient 09/03/15 1851     Chief Complaint  Patient presents with  . Chest Pain     (Consider location/radiation/quality/duration/timing/severity/associated sxs/prior Treatment) HPI  Patient is a 39 year old male with no significant past history who presents to the emergency department with chest pain and shortness of breath. Patient states that approximately 3 hours ago he had a sudden onset of shortness of breath followed by left-sided chest pain. Sharp, constant, radiates down the left arm. Nothing improves or worsens the pain. Has not taken anything for the pain. Chest pain associated with nausea, lightheadedness, diaphoresis. Sharp chest pain resolved, then felt like a left-sided chest pressure. Continues to complain of shortness of breath. Denies history of PE/DVT. No recent surgeries or immobilization. Denies recent fevers, cough, congestion. Denies history of trauma. Uncertain of family history of MI at a young age.   Past Medical History  Diagnosis Date  . Umbilical hernia 02/2012  . Headache(784.0)     once/week  . Seasonal allergies   . Depression     no current meds.  . Anxiety   . Low back pain    Past Surgical History  Procedure Laterality Date  . Umbilical hernia repair  03/10/2012    Procedure: HERNIA REPAIR UMBILICAL ADULT;  Surgeon: Atilano Ina, MD,FACS;  Location: [facility];  Service: General;  Laterality: N/A;   Family History  Problem Relation Age of Onset  . Multiple sclerosis Mother   . Seizures Mother    Social History  Substance Use Topics  . Smoking status: Former Smoker -- 0.50 packs/day for 1 years    Types: Cigarettes  . Smokeless tobacco: Never Used     Comment: quit smoking years ago  . Alcohol Use: No    Review of Systems  Constitutional: Positive for diaphoresis. Negative for fever and appetite change.  HENT: Negative for  congestion and trouble swallowing.   Eyes: Negative for visual disturbance.  Respiratory: Positive for chest tightness and shortness of breath. Negative for cough and wheezing.   Cardiovascular: Positive for chest pain. Negative for palpitations.  Gastrointestinal: Negative for nausea, vomiting, abdominal pain and blood in stool.  Genitourinary: Negative for dysuria, flank pain and decreased urine volume.  Musculoskeletal: Negative for back pain and neck pain.  Skin: Negative for rash.  Neurological: Negative for dizziness, seizures, weakness, light-headedness and headaches.  Psychiatric/Behavioral: Negative for behavioral problems.      Allergies  Fish allergy; Shellfish allergy; and Imipramine  Home Medications   Prior to Admission medications   Medication Sig Start Date End Date Taking? Authorizing Provider  cetirizine (ZYRTEC) 10 MG tablet Take 10 mg by mouth daily.   Yes Historical Provider, MD  EPINEPHrine 0.3 mg/0.3 mL IJ SOAJ injection Inject 0.3 mLs (0.3 mg total) into the muscle daily as needed (for allergic reactions). 08/08/15  Yes Veryl Speak, FNP  ibuprofen (ADVIL,MOTRIN) 800 MG tablet Take 1 tablet (800 mg total) by mouth 3 (three) times daily. Patient taking differently: Take 800 mg by mouth every 8 (eight) hours as needed (for pain.).  12/14/14  Yes Elson Areas, PA-C  Multiple Vitamin (MULTIVITAMIN WITH MINERALS) TABS Take 1 tablet by mouth daily.   Yes Historical Provider, MD   BP 101/75 mmHg  Pulse 78  Temp(Src) 97.9 F (36.6 C) (Oral)  Resp 13  Ht  (1.778 m)  Wt 112.492 kg  BMI 35.58 kg/m2  SpO2 96% Physical Exam  Constitutional: He is oriented to person, place, and time. He appears well-developed and well-nourished. No distress.  HENT:  Head: Normocephalic and atraumatic.  Mouth/Throat: Oropharynx is clear and moist.  Eyes: Conjunctivae and EOM are normal. Pupils are equal, round, and reactive to light.  Neck: Normal range of motion. Neck  supple. No JVD present. No tracheal deviation present.  Cardiovascular: Normal rate, regular rhythm, normal heart sounds and intact distal pulses.   No murmur heard. Pulmonary/Chest: Effort normal and breath sounds normal. No respiratory distress. He has no wheezes. He has no rales. He exhibits no tenderness.  Abdominal: Soft. He exhibits no distension. There is no tenderness. There is no rebound and no guarding.  Musculoskeletal: Normal range of motion.  Neurological: He is alert and oriented to person, place, and time.  Skin: Skin is warm. He is not diaphoretic.  Psychiatric: He has a normal mood and affect.    ED Course  Procedures (including critical care time) Labs Review Labs Reviewed  BASIC METABOLIC PANEL - Abnormal; Notable for the following:    Glucose, Bld 103 (*)    All other components within normal limits  CBC  D-DIMER, QUANTITATIVE (NOT AT [facility])  Rosezena SensorI-STAT TROPOININ, ED  Rosezena SensorI-STAT TROPOININ, ED    Imaging Review Dg Chest 2 View  09/03/2015  CLINICAL DATA:  Acute left chest pain EXAM: CHEST  2 VIEW COMPARISON:  08/26/2013 FINDINGS: Similar low lung volumes. Normal heart size and vascularity. Prominent left epicardial fat shadow as before. No definite superimposed pneumonia, collapse or consolidation. No edema, effusion, or pneumothorax. Trachea midline. No acute osseous finding. IMPRESSION: Stable low volume chest exam. No significant interval change or superimposed acute process. Electronically Signed   By: Judie PetitM.  Shick M.D.   On: 09/03/2015 17:17   I have personally reviewed and evaluated these images and lab results as part of my medical decision-making.   EKG Interpretation   Date/Time:  Tuesday September 03 2015 16:48:17 EST Ventricular Rate:  99 PR Interval:  164 QRS Duration: 84 QT Interval:  344 QTC Calculation: 441 R Axis:   2 Text Interpretation:  Normal sinus rhythm Inferior infarct , age  undetermined Abnormal ECG Confirmed by BEATON  MD, ROBERT [id number]) on   09/03/2015 7:49:16 PM      MDM   Final diagnoses:  Acute chest pain    Patient is a [age] year old male with no significant past medical history who presents to the emergency department with chest pain and shortness of breath. Patient with typical chest pain that started 3 hours ago. Chest pain resolved prior to arrival. Afebrile, hemodynamically stable, SPO2 92% on RA. On arrival in no acute distress, not ill-appearing. Exam as above, notable for lungs CTAB, RRR, intact distal pulses, benign abdominal exam.  Differential diagnosis includes ACS, pneumonia, pulmonary embolism, pneumothorax, dissection, gastritis/PUD, esophageal spasm. HEART score 1, low risk. Wells criteria low risk. Given aspirin 325 mg. Not given nitroglycerin since chest pain has resolved.  EKG showed normal sinus rhythm. Normal intervals. No signs of change per enlargement. No signs of ischemia. No prior EKG to compare to. CXR showed no acute findings. Doubt aortic dissection, no risk factors, intact distal pulses, no radiation to his back. Given low risk for ACS will obtain serial troponins. Given low risk for Wells criteria will obtain a d-dimer.  Serial troponins negative. D-dimer <0.27.  Patient's chest pain resolved. SPO2 99% on RA. Given the patient is low risk do not feel  that stress test is necessary at this time.  Patient discharged home in stable condition. Discussed follow-up with PCP in the next 1-2 days. Discussed strict return precautions to the emergency department for any return of chest pain or worsening of symptoms. Patient's expressed understanding, no questions or concerns at time of discharge.   Corena Herter, MD 09/04/15 1610  Nelva Nay, MD 09/04/15 [phone]

## 2015-09-03 NOTE — ED Notes (Signed)
Pt reports at 330 today he suddenly had a pain that felt like someone punched him in the L side of chest. Pain radiated down L arm and he became sob. Pain has eased but still persists and sts more sob now.

## 2015-09-03 NOTE — Discharge Instructions (Signed)
Follow-up with primary care physician for any worsening of symptoms. Return to the emergency department if return of chest pain or shortness of breath.

## 2015-09-06 ENCOUNTER — Encounter: Payer: Self-pay | Admitting: Physician Assistant

## 2015-09-06 ENCOUNTER — Other Ambulatory Visit (INDEPENDENT_AMBULATORY_CARE_PROVIDER_SITE_OTHER): Payer: 59

## 2015-09-06 ENCOUNTER — Ambulatory Visit (INDEPENDENT_AMBULATORY_CARE_PROVIDER_SITE_OTHER): Payer: 59 | Admitting: Physician Assistant

## 2015-09-06 VITALS — BP 110/64 | HR 82 | Temp 97.7°F | Wt 249.8 lb

## 2015-09-06 DIAGNOSIS — R0789 Other chest pain: Secondary | ICD-10-CM | POA: Diagnosis not present

## 2015-09-06 DIAGNOSIS — R071 Chest pain on breathing: Secondary | ICD-10-CM

## 2015-09-06 LAB — COMPREHENSIVE METABOLIC PANEL
ALT: 44 U/L (ref 0–53)
AST: 24 U/L (ref 0–37)
Albumin: 3.9 g/dL (ref 3.5–5.2)
Alkaline Phosphatase: 49 U/L (ref 39–117)
BUN: 16 mg/dL (ref 6–23)
CHLORIDE: 103 meq/L (ref 96–112)
CO2: 26 mEq/L (ref 19–32)
CREATININE: 0.81 mg/dL (ref 0.40–1.50)
Calcium: 9.4 mg/dL (ref 8.4–10.5)
GFR: 112.09 mL/min (ref >60.00)
GLUCOSE: 97 mg/dL (ref 70–99)
POTASSIUM: 4.2 meq/L (ref 3.5–5.1)
SODIUM: 137 meq/L (ref 135–145)
TOTAL PROTEIN: 7 g/dL (ref 6.0–8.3)
Total Bilirubin: 0.5 mg/dL (ref 0.2–1.2)

## 2015-09-06 LAB — LIPASE: LIPASE: 16 U/L (ref 11.0–59.0)

## 2015-09-06 MED ORDER — FENOFIBRATE 48 MG PO TABS: 48.0000 mg | ORAL_TABLET | Freq: Every day | ORAL | Status: AC

## 2015-09-06 MED ORDER — TRAMADOL HCL 50 MG PO TABS: 50.0000 mg | ORAL_TABLET | Freq: Three times a day (TID) | ORAL | Status: DC | PRN

## 2015-09-06 NOTE — Progress Notes (Signed)
Patient presents to clinic today for ER follow-up of chest pain. Patient seen in ER on 09/03/15 with c/o chest pain described as acute onset, left-sided and persistent. EKG, CXR, troponin and d-dimer performed, all unremarkable. Patient endorses continued chest pain described at the top of his abdomen, associated with chest pressure and a feeling of anxiety. Patient has history of elevated TGL. Is prescribed fenofibrate but is not taking as directed. Denies nausea or vomiting.  Denies history of asthma or COPD. Denies smoking history. Denies reflux or indigestion.  Past Medical History  Diagnosis Date  . Umbilical hernia 10/7508  . Headache(784.0)     once/week  . Seasonal allergies   . Depression     no current meds.  . Anxiety   . Low back pain     Current Outpatient Prescriptions on File Prior to Visit  Medication Sig Dispense Refill  . cetirizine (ZYRTEC) 10 MG tablet Take 10 mg by mouth daily.    Marland Kitchen EPINEPHrine 0.3 mg/0.3 mL IJ SOAJ injection Inject 0.3 mLs (0.3 mg total) into the muscle daily as needed (for allergic reactions). 1 Device 1  . ibuprofen (ADVIL,MOTRIN) 800 MG tablet Take 1 tablet (800 mg total) by mouth 3 (three) times daily. (Patient taking differently: Take 800 mg by mouth every 8 (eight) hours as needed (for pain.). ) 21 tablet 0  . Multiple Vitamin (MULTIVITAMIN WITH MINERALS) TABS Take 1 tablet by mouth daily.     No current facility-administered medications on file prior to visit.    Allergies  Allergen Reactions  . Fish Allergy Anaphylaxis  . Shellfish Allergy Shortness Of Breath and Swelling  . Imipramine Rash    Family History  Problem Relation Age of Onset  . Multiple sclerosis Mother   . Seizures Mother     Social History   Social History  . Marital Status: Single    Spouse Name: N/A  . Number of Children: 1  . Years of Education: 12   Occupational History  . Cook    Social History Main Topics  . Smoking status: Former Smoker -- 0.50  packs/day for 1 years    Types: Cigarettes  . Smokeless tobacco: Never Used     Comment: quit smoking years ago  . Alcohol Use: No  . Drug Use: No  . Sexual Activity: Not Asked   Other Topics Concern  . None   Social History Narrative   Born and raised [place], [place], grew up in [place] and then back to [place]. Fun: [employer] out with dogs. Recently had his first.   2 dogs   Denies religious beliefs effecting health care.     Review of Systems - See HPI.  All other ROS are negative.  BP 110/64 mmHg  Pulse 82  Temp(Src) 97.7 F (36.5 C) (Oral)  Wt 249 lb 12 oz (113.286 kg)  SpO2 95%  Physical Exam  Constitutional: He is oriented to person, place, and time and well-developed, well-nourished, and in no distress.  HENT:  Head: Normocephalic and atraumatic.  Eyes: Conjunctivae are normal.  Neck: Neck supple.  Cardiovascular: Normal rate, regular rhythm, normal heart sounds and intact distal pulses.   Pulmonary/Chest: Effort normal and breath sounds normal. No respiratory distress. He has no wheezes. He has no rales. He exhibits tenderness.  Abdominal: He exhibits no distension and no mass. There is no rebound and no guarding.  + epigastric pain with palpation.  Neurological: He is alert and oriented to person, place, and time.  Skin: Skin is warm and dry.  Psychiatric: Affect normal.  Vitals reviewed.   Recent Results (from the past 2160 hour(s))  Comprehensive metabolic panel     Status: Abnormal   Collection Time: 08/08/15 11:01 AM  Result Value Ref Range   Sodium 140 135 - 145 mEq/L   Potassium 4.7 3.5 - 5.1 mEq/L   Chloride 105 96 - 112 mEq/L   CO2 26 19 - 32 mEq/L   Glucose, Bld 93 70 - 99 mg/dL   BUN 25 (H) 6 - 23 mg/dL   Creatinine, Ser 1.29 0.40 - 1.50 mg/dL   Total Bilirubin 0.4 0.2 - 1.2 mg/dL   Alkaline Phosphatase 46 39 - 117 U/L   AST 21 0 - 37 U/L   ALT 34 0 - 53 U/L   Total Protein 6.6 6.0 - 8.3 g/dL   Albumin 3.7 3.5 - 5.2 g/dL   Calcium 9.4 8.4 - 10.5 mg/dL    GFR 65.54 >60.00 mL/min  CBC     Status: None   Collection Time: 08/08/15 11:01 AM  Result Value Ref Range   WBC 7.2 4.0 - 10.5 K/uL   RBC 4.70 4.22 - 5.81 Mil/uL   Platelets 267.0 150.0 - 400.0 K/uL   Hemoglobin 15.1 13.0 - 17.0 g/dL   HCT 44.5 39.0 - 52.0 %   MCV 94.7 78.0 - 100.0 fl   MCHC 33.9 30.0 - 36.0 g/dL   RDW 12.8 11.5 - 15.5 %  Lipid panel     Status: Abnormal   Collection Time: 08/08/15 11:01 AM  Result Value Ref Range   Cholesterol 174 0 - 200 mg/dL    Comment: ATP III Classification       Desirable:  < 200 mg/dL               Borderline High:  200 - 239 mg/dL          High:  > = 240 mg/dL   Triglycerides 321.0 (H) 0.0 - 149.0 mg/dL    Comment: Normal:  <150 mg/dLBorderline High:  150 - 199 mg/dL   HDL 39.10 >39.00 mg/dL   VLDL 64.2 (H) 0.0 - 40.0 mg/dL   Total CHOL/HDL Ratio 4     Comment:                Men          Women1/2 Average Risk     3.4          3.3Average Risk          5.0          4.42X Average Risk          9.6          7.13X Average Risk          15.0          11.0                       NonHDL 135.13     Comment: NOTE:  Non-HDL goal should be 30 mg/dL higher than patient's LDL goal (i.e. LDL goal of < 70 mg/dL, would have non-HDL goal of < 100 mg/dL)  TSH     Status: None   Collection Time: 08/08/15 11:01 AM  Result Value Ref Range   TSH 1.30 0.35 - 4.50 uIU/mL  LDL cholesterol, direct     Status: None   Collection Time: 08/08/15 11:01 AM  Result Value Ref Range  Direct LDL 107.0 mg/dL    Comment: Optimal:  <100 mg/dLNear or Above Optimal:  100-129 mg/dLBorderline High:  130-159 mg/dLHigh:  160-189 mg/dLVery High:  >190 mg/dL  Basic metabolic panel     Status: Abnormal   Collection Time: 09/03/15  4:59 PM  Result Value Ref Range   Sodium 138 135 - 145 mmol/L   Potassium 4.1 3.5 - 5.1 mmol/L   Chloride 106 101 - 111 mmol/L   CO2 22 22 - 32 mmol/L   Glucose, Bld 103 (H) 65 - 99 mg/dL   BUN 16 6 - 20 mg/dL   Creatinine, Ser 0.93 0.61 - 1.24 mg/dL     Calcium 9.5 8.9 - 10.3 mg/dL   GFR calc non Af Amer >60 >60 mL/min   GFR calc Af Amer >60 >60 mL/min    Comment: (NOTE) The eGFR has been calculated using the CKD EPI equation. This calculation has not been validated in all clinical situations. eGFR's persistently <60 mL/min signify possible Chronic Kidney Disease.    Anion gap 10 5 - 15  CBC     Status: None   Collection Time: 09/03/15  4:59 PM  Result Value Ref Range   WBC 9.2 4.0 - 10.5 K/uL   RBC 4.56 4.22 - 5.81 MIL/uL   Hemoglobin 14.9 13.0 - 17.0 g/dL   HCT 43.3 39.0 - 52.0 %   MCV 95.0 78.0 - 100.0 fL   MCH 32.7 26.0 - 34.0 pg   MCHC 34.4 30.0 - 36.0 g/dL   RDW 12.6 11.5 - 15.5 %   Platelets 236 150 - 400 K/uL  I-stat troponin, ED (not at [facility])     Status: None   Collection Time: 09/03/15  5:03 PM  Result Value Ref Range   Troponin i, poc 0.00 0.00 - 0.08 ng/mL   Comment 3            Comment: Due to the release kinetics of cTnI, a negative result within the first hours of the onset of symptoms does not rule out myocardial infarction with certainty. If myocardial infarction is still suspected, repeat the test at appropriate intervals.   D-dimer, quantitative (not at [facility])     Status: None   Collection Time: 09/03/15  7:55 PM  Result Value Ref Range   D-Dimer, Quant <0.27 0.00 - 0.50 ug/mL-FEU    Comment: Please note change in reference range. (NOTE) At the manufacturer cut-off of 0.50 ug/mL FEU, this assay has been documented to exclude PE with a sensitivity and negative predictive value of 97 to 99%.  At this time, this assay has not been approved by the FDA to exclude DVT/VTE. Results should be correlated with clinical presentation.   I-stat troponin, ED     Status: None   Collection Time: 09/03/15  8:23 PM  Result Value Ref Range   Troponin i, poc 0.00 0.00 - 0.08 ng/mL   Comment 3            Comment: Due to the release kinetics of cTnI, a negative result within the first hours of the onset of  symptoms does not rule out myocardial infarction with certainty. If myocardial infarction is still suspected, repeat the test at appropriate intervals.     Assessment/Plan: Atypical chest pain Costochrondritis. Also concern for pancreatitis due to history, epigastric pain and medication non-compliance. EKG with NSR. EMR reviewed -- again negative CXR, EKG, d-dimer and troponin. STAT cbc, cmp and liapse obtained today. Liquid diet recommended. Begin fenofibrate  as directed. Liquid diet. ER if anything worsens. Follow-up with PCP on Monday.

## 2015-09-06 NOTE — Assessment & Plan Note (Signed)
Costochrondritis. Also concern for pancreatitis due to history, epigastric pain and medication non-compliance. EKG with NSR. EMR reviewed -- again negative CXR, EKG, d-dimer and troponin. STAT cbc, cmp and liapse obtained today. Liquid diet recommended. Begin fenofibrate as directed. Liquid diet. ER if anything worsens. Follow-up with PCP on Monday.

## 2015-09-06 NOTE — Progress Notes (Signed)
Pre visit review using our clinic review tool, if applicable. No additional management support is needed unless otherwise documented below in the visit note. 

## 2015-09-06 NOTE — Patient Instructions (Signed)
Please stay well hydrated and eat a liquid diet over the next 24 hours to help with pain.  Take the Tramadol as directed. No heavy lifting or overexertion.  Make sure to take your fenofibrate as directed to keep your triglycerides down. I feel you have inflammation in rib cartilage and potential inflammation in your pancreas. The measures above will help control symptoms while we are waiting on lab results.  If I see anything worrisome on labs I will call you from home. If anything worsens, please go to the ER.

## 2015-09-09 ENCOUNTER — Encounter: Payer: Self-pay | Admitting: Family

## 2015-09-09 ENCOUNTER — Ambulatory Visit (INDEPENDENT_AMBULATORY_CARE_PROVIDER_SITE_OTHER): Payer: 59 | Admitting: Family

## 2015-09-09 ENCOUNTER — Ambulatory Visit (INDEPENDENT_AMBULATORY_CARE_PROVIDER_SITE_OTHER)
Admission: RE | Admit: 2015-09-09 | Discharge: 2015-09-09 | Disposition: A | Discharge status: Home / Self Care | Payer: 59 | Source: Ambulatory Visit | Attending: Family | Admitting: Family

## 2015-09-09 VITALS — BP 102/80 | HR 90 | Temp 97.4°F | Resp 18 | Ht 70.0 in | Wt 253.6 lb

## 2015-09-09 DIAGNOSIS — R1084 Generalized abdominal pain: Secondary | ICD-10-CM

## 2015-09-09 MED ORDER — IOHEXOL 300 MG/ML  SOLN
100.0000 mL | Freq: Once | INTRAMUSCULAR | Status: AC | PRN
  Administered 2015-09-09: 100 mL via INTRAVENOUS

## 2015-09-09 NOTE — Patient Instructions (Addendum)
Thank you for choosing [facility].  Summary/Instructions:  Please go to [facility] for a CT of your abdomen. There is concern for appendicitis or gall stones.

## 2015-09-09 NOTE — Progress Notes (Signed)
Pre visit review using our clinic review tool, if applicable. No additional management support is needed unless otherwise documented below in the visit note. 

## 2015-09-09 NOTE — Assessment & Plan Note (Addendum)
Squeezing pain of questionable origin and diffuse chest/abdomnial tenderness. Obtain STAT CT scan to rule out gall stones or appendicitis. Unlikely pancreatitis given negative lipase and normal LFTs. Cardiac and PE work up all negative. Follow up pending CT scan.

## 2015-09-09 NOTE — Progress Notes (Signed)
Subjective:    Patient ID: Julian Cook, male    DOB: November 17, 1975, 39 y.o.   MRN: [id number]  Chief Complaint  Patient presents with  . Follow-up    nausea, chest pain, and was told it was inflammed pancreas    HPI:  Julian Cook is a 39 y.o. male who  has a past medical history of Umbilical hernia (02/2012); Headache(784.0); Seasonal allergies; Depression; Anxiety; and Low back pain. and presents today for an office follow up.  Previously seen in the ED and office for atypical chest pain with negative cardiac work-up during both visits. During the office visit there was some concern for possible pancreatitis, however lab results were normal. Continues to experience the associated symptoms of nausea and and soreness in his back. Denies any trauma to the area or sounds/sensations heard or felt. Described as feeling like he is carrying an elephant around and something is sitting on his chest. Notes the discomfort is constant. Denies anything specific that makes the pain worse. Denies any vomiting or diarrhea.    Allergies  Allergen Reactions  . Fish Allergy Anaphylaxis  . Shellfish Allergy Shortness Of Breath and Swelling  . Imipramine Rash    Current Outpatient Prescriptions on File Prior to Visit  Medication Sig Dispense Refill  . cetirizine (ZYRTEC) 10 MG tablet Take 10 mg by mouth daily.    Marland Kitchen EPINEPHrine 0.3 mg/0.3 mL IJ SOAJ injection Inject 0.3 mLs (0.3 mg total) into the muscle daily as needed (for allergic reactions). 1 Device 1  . fenofibrate (TRICOR) 48 MG tablet Take 1 tablet (48 mg total) by mouth daily. 90 tablet 2  . ibuprofen (ADVIL,MOTRIN) 800 MG tablet Take 1 tablet (800 mg total) by mouth 3 (three) times daily. (Patient taking differently: Take 800 mg by mouth every 8 (eight) hours as needed (for pain.). ) 21 tablet 0  . Multiple Vitamin (MULTIVITAMIN WITH MINERALS) TABS Take 1 tablet by mouth daily.    . traMADol (ULTRAM) 50 MG tablet Take 1 tablet (50 mg total) by  mouth every 8 (eight) hours as needed. 30 tablet 0   No current facility-administered medications on file prior to visit.    Review of Systems  Constitutional: Negative for fever and chills.  Cardiovascular: Positive for chest pain. Negative for palpitations and leg swelling.  Gastrointestinal: Positive for nausea and abdominal pain. Negative for vomiting, diarrhea and constipation.  Genitourinary: Positive for flank pain. Negative for dysuria, urgency, frequency, hematuria, penile pain and testicular pain.      Objective:    BP 102/80 mmHg  Pulse 90  Temp(Src) 97.4 F (36.3 C) (Oral)  Resp 18  Ht  (1.778 m)  Wt 253 lb 9.6 oz (115.032 kg)  BMI 36.39 kg/m2  SpO2 97% Nursing note and vital signs reviewed.  Physical Exam  Constitutional: He is oriented to person, place, and time. He appears well-developed and well-nourished. No distress.  Cardiovascular: Normal rate, regular rhythm, normal heart sounds and intact distal pulses.   Pulmonary/Chest: Effort normal and breath sounds normal.  Abdominal: There is tenderness in the right upper quadrant, epigastric area and suprapubic area.  Neurological: He is alert and oriented to person, place, and time.  Skin: Skin is warm and dry.  Psychiatric: He has a normal mood and affect. His behavior is normal. Judgment and thought content normal.       Assessment & Plan:   Problem List Items Addressed This Visit      Other  Generalized abdominal pain - Primary    Squeezing pain of questionable origin and diffuse chest/abdomnial tenderness. Obtain STAT CT scan to rule out gall stones or appendicitis. Unlikely pancreatitis given negative lipase and normal LFTs. Cardiac and PE work up all negative. Follow up pending CT scan.       Relevant Orders   CT Abdomen Pelvis W Contrast (Completed)

## 2015-09-10 ENCOUNTER — Other Ambulatory Visit: Payer: 59

## 2015-11-01 ENCOUNTER — Emergency Department (HOSPITAL_COMMUNITY): Payer: 59

## 2015-11-01 ENCOUNTER — Encounter (HOSPITAL_COMMUNITY): Payer: Self-pay

## 2015-11-01 ENCOUNTER — Emergency Department (HOSPITAL_COMMUNITY)
Admission: EM | Admit: 2015-11-01 | Discharge: 2015-11-01 | Disposition: A | Discharge status: Home / Self Care | Payer: 59 | Attending: Emergency Medicine | Admitting: Emergency Medicine

## 2015-11-01 DIAGNOSIS — F419 Anxiety disorder, unspecified: Secondary | ICD-10-CM | POA: Diagnosis not present

## 2015-11-01 DIAGNOSIS — Z87891 Personal history of nicotine dependence: Secondary | ICD-10-CM | POA: Insufficient documentation

## 2015-11-01 DIAGNOSIS — F329 Major depressive disorder, single episode, unspecified: Secondary | ICD-10-CM | POA: Insufficient documentation

## 2015-11-01 DIAGNOSIS — E669 Obesity, unspecified: Secondary | ICD-10-CM | POA: Insufficient documentation

## 2015-11-01 DIAGNOSIS — Z79899 Other long term (current) drug therapy: Secondary | ICD-10-CM | POA: Insufficient documentation

## 2015-11-01 DIAGNOSIS — R05 Cough: Secondary | ICD-10-CM | POA: Diagnosis not present

## 2015-11-01 DIAGNOSIS — J111 Influenza due to unidentified influenza virus with other respiratory manifestations: Secondary | ICD-10-CM | POA: Diagnosis not present

## 2015-11-01 DIAGNOSIS — R69 Illness, unspecified: Secondary | ICD-10-CM

## 2015-11-01 DIAGNOSIS — J189 Pneumonia, unspecified organism: Secondary | ICD-10-CM | POA: Diagnosis not present

## 2015-11-01 DIAGNOSIS — J159 Unspecified bacterial pneumonia: Secondary | ICD-10-CM | POA: Insufficient documentation

## 2015-11-01 LAB — CBC WITH DIFFERENTIAL/PLATELET
BASOS PCT: 0 %
Basophils Absolute: 0 10*3/uL (ref 0.0–0.1)
EOS ABS: 0.1 10*3/uL (ref 0.0–0.7)
EOS PCT: 1 %
HCT: 43.9 % (ref 39.0–52.0)
HEMOGLOBIN: 14.7 g/dL (ref 13.0–17.0)
LYMPHS ABS: 1.6 10*3/uL (ref 0.7–4.0)
Lymphocytes Relative: 14 %
MCH: 32.6 pg (ref 26.0–34.0)
MCHC: 33.5 g/dL (ref 30.0–36.0)
MCV: 97.3 fL (ref 78.0–100.0)
MONOS PCT: 5 %
Monocytes Absolute: 0.6 10*3/uL (ref 0.1–1.0)
NEUTROS PCT: 80 %
Neutro Abs: 9.6 10*3/uL — ABNORMAL HIGH (ref 1.7–7.7)
PLATELETS: 206 10*3/uL (ref 150–400)
RBC: 4.51 MIL/uL (ref 4.22–5.81)
RDW: 12.8 % (ref 11.5–15.5)
WBC: 12 10*3/uL — AB (ref 4.0–10.5)

## 2015-11-01 LAB — BASIC METABOLIC PANEL
Anion gap: 9 (ref 5–15)
BUN: 12 mg/dL (ref 6–20)
CALCIUM: 9.3 mg/dL (ref 8.9–10.3)
CO2: 26 mmol/L (ref 22–32)
CREATININE: 0.79 mg/dL (ref 0.61–1.24)
Chloride: 104 mmol/L (ref 101–111)
Glucose, Bld: 116 mg/dL — ABNORMAL HIGH (ref 65–99)
Potassium: 4.3 mmol/L (ref 3.5–5.1)
SODIUM: 139 mmol/L (ref 135–145)

## 2015-11-01 MED ORDER — AZITHROMYCIN 250 MG PO TABS: ORAL_TABLET | ORAL | Status: DC

## 2015-11-01 MED ORDER — HYDROCODONE-ACETAMINOPHEN 5-325 MG PO TABS: ORAL_TABLET | ORAL | Status: DC

## 2015-11-01 MED ORDER — OSELTAMIVIR PHOSPHATE 75 MG PO CAPS
75.0000 mg | ORAL_CAPSULE | Freq: Once | ORAL | Status: AC
  Administered 2015-11-01: 75 mg via ORAL
  Filled 2015-11-01: qty 1

## 2015-11-01 MED ORDER — ALBUTEROL SULFATE HFA 108 (90 BASE) MCG/ACT IN AERS
2.0000 | INHALATION_SPRAY | Freq: Once | RESPIRATORY_TRACT | Status: AC
  Administered 2015-11-01: 2 via RESPIRATORY_TRACT
  Filled 2015-11-01: qty 6.7

## 2015-11-01 MED ORDER — ACETAMINOPHEN 500 MG PO TABS
1000.0000 mg | ORAL_TABLET | Freq: Once | ORAL | Status: AC
  Administered 2015-11-01: 1000 mg via ORAL
  Filled 2015-11-01: qty 2

## 2015-11-01 MED ORDER — IPRATROPIUM-ALBUTEROL 0.5-2.5 (3) MG/3ML IN SOLN
3.0000 mL | Freq: Once | RESPIRATORY_TRACT | Status: AC
  Administered 2015-11-01: 3 mL via RESPIRATORY_TRACT
  Filled 2015-11-01: qty 3

## 2015-11-01 MED ORDER — DEXTROSE 5 % IV SOLN
1.0000 g | Freq: Once | INTRAVENOUS | Status: AC
  Administered 2015-11-01: 1 g via INTRAVENOUS
  Filled 2015-11-01: qty 10

## 2015-11-01 MED ORDER — OSELTAMIVIR PHOSPHATE 75 MG PO CAPS: 75.0000 mg | ORAL_CAPSULE | Freq: Two times a day (BID) | ORAL | Status: DC

## 2015-11-01 MED ORDER — SODIUM CHLORIDE 0.9 % IV BOLUS (SEPSIS)
1000.0000 mL | Freq: Once | INTRAVENOUS | Status: AC
  Administered 2015-11-01: 1000 mL via INTRAVENOUS

## 2015-11-01 MED FILL — HYDROCODON-APAP 5-325: 5-325 | 1 days supply | Qty: 7 | Fill #0

## 2015-11-01 MED FILL — AZITHROMYCIN 250 MG TABLET: 250 | 5 days supply | Qty: 6 | Fill #0

## 2015-11-01 MED FILL — OSELTAMIVIR PHOS 75 MG CAP: 75 | 5 days supply | Qty: 10 | Fill #0

## 2015-11-01 NOTE — ED Notes (Signed)
Pt reports cough, congestion and body aches that began yesterday afternoon.  Pt reports that he coughed this morning and it was blood tinged so he came on in.  Pt has appointment with PMD this afternoon.

## 2015-11-01 NOTE — Discharge Instructions (Signed)
You will need to follow with your primary care doctor for a recheck of your chest x-ray in 6-8 weeks  Return to the emergency room for any worsening or concerning symptoms including fast breathing, heart racing, confusion, vomiting.  Rest, cover your mouth when you cough and wash your hands frequently.   Push fluids: water or Gatorade, do not drink any soda, juice or caffeinated beverages.  For fever and pain control you can take Motrin (ibuprofen) as follows: 400 mg (this is normally 2 over the counter pills) every 4 hours with food.  Do not return to work until a day after your fever breaks.   Take Vicodin for cough and pain control, do not drink alcohol, drive, care for children or do other critical tasks while taking Vicodin     Influenza, Adult Influenza ("the flu") is a viral infection of the respiratory tract. It occurs more often in winter months because people spend more time in close contact with one another. Influenza can make you feel very sick. Influenza easily spreads from person to person (contagious). CAUSES  Influenza is caused by a virus that infects the respiratory tract. You can catch the virus by breathing in droplets from an infected person's cough or sneeze. You can also catch the virus by touching something that was recently contaminated with the virus and then touching your mouth, nose, or eyes. RISKS AND COMPLICATIONS You may be at risk for a more severe case of influenza if you smoke cigarettes, have diabetes, have chronic heart disease (such as heart failure) or lung disease (such as asthma), or if you have a weakened immune system. Elderly people and pregnant women are also at risk for more serious infections. The most common problem of influenza is a lung infection (pneumonia). Sometimes, this problem can require emergency medical care and may be life threatening. SIGNS AND SYMPTOMS  Symptoms typically last 4 to 10 days and may  include:  Fever.  Chills.  Headache, body aches, and muscle aches.  Sore throat.  Chest discomfort and cough.  Poor appetite.  Weakness or feeling tired.  Dizziness.  Nausea or vomiting. DIAGNOSIS  Diagnosis of influenza is often made based on your history and a physical exam. A nose or throat swab test can be done to confirm the diagnosis. TREATMENT  In mild cases, influenza goes away on its own. Treatment is directed at relieving symptoms. For more severe cases, your health care provider may prescribe antiviral medicines to shorten the sickness. Antibiotic medicines are not effective because the infection is caused by a virus, not by bacteria. HOME CARE INSTRUCTIONS  Take medicines only as directed by your health care provider.  Use a cool mist humidifier to make breathing easier.  Get plenty of rest until your temperature returns to normal. This usually takes 3 to 4 days.  Drink enough fluid to keep your urine clear or pale yellow.  Cover yourmouth and nosewhen coughing or sneezing,and wash your handswellto prevent thevirusfrom spreading.  Stay homefromwork orschool untilthe fever is gonefor at least 52full day. PREVENTION  An annual influenza vaccination (flu shot) is the best way to avoid getting influenza. An annual flu shot is now routinely recommended for all adults in the [place] SEEK MEDICAL CARE IF:  You experiencechest pain, yourcough worsens,or you producemore mucus.  Youhave nausea,vomiting, ordiarrhea.  Your fever returns or gets worse. SEEK IMMEDIATE MEDICAL CARE IF:  You havetrouble breathing, you become short of breath,or your skin ornails becomebluish.  You have severe painor  stiffnessin the neck.  You develop a sudden headache, or pain in the face or ear.  You have nausea or vomiting that you cannot control. MAKE SURE YOU:   Understand these instructions.  Will watch your condition.  Will get help right away if you  are not doing well or get worse.   This information is not intended to replace advice given to you by your health care provider. Make sure you discuss any questions you have with your health care provider.   Document Released: 09/25/2000 Document Revised: 10/19/2014 Document Reviewed: 12/28/2011 Elsevier Interactive Patient Education [employer]

## 2015-11-01 NOTE — ED Provider Notes (Signed)
CSN: [id number]     Arrival date & time 11/01/15  4098 History   First MD Initiated Contact with Patient 11/01/15 [phone]     Chief Complaint  Patient presents with  . URI     (Consider location/radiation/quality/duration/timing/severity/associated sxs/prior Treatment) HPI   Blood pressure 129/83, pulse 89, temperature 98.3 F (36.8 C), temperature source Oral, resp. rate 20, height 6' (1.829 m), weight 112.492 kg, SpO2 96 %.  Julian Cook is a 40 y.o. male complaining of rhinorrhea, productive cough, myalgia, sore throat onset yesterday afternoon, cough became blood-tinged this a.m. so he presented to the ED for evaluation. Patient denies fever, chills, nausea, vomiting, no focal abdominal pain or change in urination. On review of systems, he endorses a pleuritic chest pain with mild shortness of breath, and diarrhea. Patient works in [employer] of the hospital and has had a flu shot this year.  Past Medical History  Diagnosis Date  . Umbilical hernia 02/2012  . Headache(784.0)     once/week  . Seasonal allergies   . Depression     no current meds.  . Anxiety   . Low back pain    Past Surgical History  Procedure Laterality Date  . Umbilical hernia repair  03/10/2012    Procedure: HERNIA REPAIR UMBILICAL ADULT;  Surgeon: Atilano Ina, MD,FACS;  Location: [facility];  Service: General;  Laterality: N/A;   Family History  Problem Relation Age of Onset  . Multiple sclerosis Mother   . Seizures Mother    Social History  Substance Use Topics  . Smoking status: Former Smoker -- 0.50 packs/day for 1 years    Types: Cigarettes  . Smokeless tobacco: Never Used     Comment: quit smoking years ago  . Alcohol Use: No    Review of Systems  10 systems reviewed and found to be negative, except as noted in the HPI.   Allergies  Fish allergy; Shellfish allergy; and Imipramine  Home Medications   Prior to Admission medications   Medication Sig Start Date End  Date Taking? Authorizing Provider  cetirizine (ZYRTEC) 10 MG tablet Take 10 mg by mouth daily.   Yes Historical Provider, MD  fenofibrate (TRICOR) 48 MG tablet Take 1 tablet (48 mg total) by mouth daily. 09/06/15  Yes Veryl Speak, FNP  Multiple Vitamin (MULTIVITAMIN WITH MINERALS) TABS Take 1 tablet by mouth daily.   Yes Historical Provider, MD  Phenyleph-CPM-DM-Aspirin (ALKA-SELTZER PLUS COLD & COUGH PO) Take 1 tablet by mouth 2 (two) times daily as needed (cough and cold).   Yes Historical Provider, MD  azithromycin (ZITHROMAX Z-PAK) 250 MG tablet 2 po day one, then 1 daily x 4 days 11/01/15   Joni Reining Leno Mathes, PA-C  EPINEPHrine 0.3 mg/0.3 mL IJ SOAJ injection Inject 0.3 mLs (0.3 mg total) into the muscle daily as needed (for allergic reactions). 08/08/15   Veryl Speak, FNP  HYDROcodone-acetaminophen (NORCO/VICODIN) 5-325 MG tablet Take 1-2 tablets by mouth every 6 hours as needed for pain and/or cough. 11/01/15   Kiko Ripp, PA-C  oseltamivir (TAMIFLU) 75 MG capsule Take 1 capsule (75 mg total) by mouth every 12 (twelve) hours. 11/01/15   Nene Aranas, PA-C   BP 113/67 mmHg  Pulse 93  Temp(Src) 98.5 F (36.9 C) (Oral)  Resp 17  Ht 6' (1.829 m)  Wt 112.492 kg  BMI 33.63 kg/m2  SpO2 95% Physical Exam  Constitutional: He is oriented to person, place, and time. He appears well-developed and well-nourished.  No distress.  Obese  HENT:  Head: Normocephalic.  Right Ear: External ear normal.  Left Ear: External ear normal.  Mouth/Throat: Oropharynx is clear and moist. No oropharyngeal exudate.  No drooling or stridor. Posterior pharynx mildly erythematous no significant tonsillar hypertrophy. No exudate. Soft palate rises symmetrically. No TTP or induration under tongue.   No tenderness to palpation of frontal or bilateral maxillary sinuses.  Mild mucosal edema in the nares with scant rhinorrhea.  Bilateral tympanic membranes with normal architecture and good light  reflex.    Eyes: Conjunctivae and EOM are normal. Pupils are equal, round, and reactive to light.  Neck: Normal range of motion. Neck supple.  Cardiovascular: Normal rate, regular rhythm and intact distal pulses.   Pulmonary/Chest: Effort normal and breath sounds normal. No stridor. No respiratory distress. He has no wheezes. He has no rales. He exhibits no tenderness.  Abdominal: Soft. Bowel sounds are normal. He exhibits no distension and no mass. There is no tenderness. There is no rebound and no guarding.  Musculoskeletal: Normal range of motion.  Neurological: He is alert and oriented to person, place, and time.  Psychiatric: He has a normal mood and affect.  Nursing note and vitals reviewed.   ED Course  Procedures (including critical care time) Labs Review Labs Reviewed  CBC WITH DIFFERENTIAL/PLATELET - Abnormal; Notable for the following:    WBC 12.0 (*)    Neutro Abs 9.6 (*)    All other components within normal limits  BASIC METABOLIC PANEL - Abnormal; Notable for the following:    Glucose, Bld 116 (*)    All other components within normal limits  CULTURE, BLOOD (ROUTINE X 2)  CULTURE, BLOOD (ROUTINE X 2)    Imaging Review Dg Chest 2 View  11/01/2015  CLINICAL DATA:  Cough EXAM: CHEST  2 VIEW COMPARISON:  09/03/2015 chest radiograph. FINDINGS: Stable cardiomediastinal silhouette with normal heart size. No pneumothorax. No pleural effusion. There is patchy opacity in both lower lobes. No pulmonary edema. IMPRESSION: Patchy opacity in both lower lobes, suspicious for multifocal pneumonia. Recommend follow-up PA and lateral post treatment chest radiographs in 6-8 weeks. Electronically Signed   By: Delbert Phenix M.D.   On: 11/01/2015 08:03   I have personally reviewed and evaluated these images and lab results as part of my medical decision-making.   EKG Interpretation None      MDM   Final diagnoses:  Influenza-like illness  CAP (community acquired pneumonia)     Filed Vitals:   11/01/15 0734 11/01/15 0736 11/01/15 0745 11/01/15 0921  BP: 129/83  116/85 113/67  Pulse: 89  96 93  Temp: 98.3 F (36.8 C)   98.5 F (36.9 C)  TempSrc: Oral   Oral  Resp: 20   17  Height: 6' (1.829 m)     Weight: 112.492 kg     SpO2: 96% 96% 95% 95%    Medications  cefTRIAXone (ROCEPHIN) 1 g in dextrose 5 % 50 mL IVPB (1 g Intravenous New Bag/Given 11/01/15 0849)  sodium chloride 0.9 % bolus 1,000 mL (0 mLs Intravenous Stopped 11/01/15 0918)  oseltamivir (TAMIFLU) capsule 75 mg (75 mg Oral Given 11/01/15 0849)  ipratropium-albuterol (DUONEB) 0.5-2.5 (3) MG/3ML nebulizer solution 3 mL (3 mLs Nebulization Given 11/01/15 0849)  albuterol (PROVENTIL HFA;VENTOLIN HFA) 108 (90 Base) MCG/ACT inhaler 2 puff (2 puffs Inhalation Given 11/01/15 0850)  acetaminophen (TYLENOL) tablet 1,000 mg (1,000 mg Oral Given 11/01/15 0849)    Julian Cook is 40 y.o. male  presenting with influenza-like illness, overall well appearing. Vital signs without abnormality. Patient is obese, likely flu will start on Tamiflu.  Chest x-ray with a bilateral lower lobe multifocal pneumonia. Will start on Rocephin and azithromycin for bacterial community-acquired pneumonia, Tamiflu to cover influenza pneumonia. Patient will be given nebulizer treatment, albuterol nebulizer to take home.  Mild leukocytosis with a left shift, normal anion gap. Cultures pending.  Patient states that he needs to leave so he can join his wife she is leaving town for the weekend, he declines to fluid bolus. Patient will stay to have the Rocephin.   Evaluation does not show pathology that would require ongoing emergent intervention or inpatient treatment. Pt is hemodynamically stable and mentating appropriately. Discussed findings and plan with patient/guardian, who agrees with care plan. All questions answered. Return precautions discussed and outpatient follow up given.   New Prescriptions   AZITHROMYCIN (ZITHROMAX Z-PAK)  250 MG TABLET    2 po day one, then 1 daily x 4 days   HYDROCODONE-ACETAMINOPHEN (NORCO/VICODIN) 5-325 MG TABLET    Take 1-2 tablets by mouth every 6 hours as needed for pain and/or cough.   OSELTAMIVIR (TAMIFLU) 75 MG CAPSULE    Take 1 capsule (75 mg total) by mouth every 12 (twelve) hours.         Wynetta Emery, PA-C 11/01/15 0914  Wynetta Emery, PA-C 11/01/15 1610  Tilden Fossa, MD 11/01/15 [phone]

## 2015-11-01 NOTE — ED Notes (Signed)
Pt reports that he has to leave and doesn't want to stay for NS bolus.  PA made aware and is ok with pt leaving.

## 2015-11-05 ENCOUNTER — Ambulatory Visit (INDEPENDENT_AMBULATORY_CARE_PROVIDER_SITE_OTHER): Payer: 59 | Admitting: Internal Medicine

## 2015-11-05 ENCOUNTER — Other Ambulatory Visit (INDEPENDENT_AMBULATORY_CARE_PROVIDER_SITE_OTHER): Payer: 59

## 2015-11-05 ENCOUNTER — Encounter: Payer: Self-pay | Admitting: Internal Medicine

## 2015-11-05 ENCOUNTER — Ambulatory Visit (INDEPENDENT_AMBULATORY_CARE_PROVIDER_SITE_OTHER)
Admission: RE | Admit: 2015-11-05 | Discharge: 2015-11-05 | Disposition: A | Discharge status: Home / Self Care | Payer: 59 | Source: Ambulatory Visit | Attending: Internal Medicine | Admitting: Internal Medicine

## 2015-11-05 VITALS — BP 136/88 | HR 98 | Temp 98.6°F | Resp 20 | Wt 250.2 lb

## 2015-11-05 DIAGNOSIS — J309 Allergic rhinitis, unspecified: Secondary | ICD-10-CM

## 2015-11-05 DIAGNOSIS — D72829 Elevated white blood cell count, unspecified: Secondary | ICD-10-CM

## 2015-11-05 DIAGNOSIS — J189 Pneumonia, unspecified organism: Secondary | ICD-10-CM | POA: Diagnosis not present

## 2015-11-05 LAB — CBC WITH DIFFERENTIAL/PLATELET
BASOS ABS: 0 10*3/uL (ref 0.0–0.1)
Basophils Relative: 0.4 % (ref 0.0–3.0)
EOS PCT: 0.6 % (ref 0.0–5.0)
Eosinophils Absolute: 0.1 10*3/uL (ref 0.0–0.7)
HEMATOCRIT: 48.6 % (ref 39.0–52.0)
HEMOGLOBIN: 16.3 g/dL (ref 13.0–17.0)
LYMPHS ABS: 2.1 10*3/uL (ref 0.7–4.0)
LYMPHS PCT: 23.2 % (ref 12.0–46.0)
MCHC: 33.7 g/dL (ref 30.0–36.0)
MCV: 94.6 fl (ref 78.0–100.0)
MONOS PCT: 6.8 % (ref 3.0–12.0)
Monocytes Absolute: 0.6 10*3/uL (ref 0.1–1.0)
NEUTROS PCT: 69 % (ref 43.0–77.0)
Neutro Abs: 6.4 10*3/uL (ref 1.4–7.7)
Platelets: 325 10*3/uL (ref 150.0–400.0)
RBC: 5.14 Mil/uL (ref 4.22–5.81)
RDW: 12.6 % (ref 11.5–15.5)
WBC: 9.2 10*3/uL (ref 4.0–10.5)

## 2015-11-05 MED ORDER — LEVOFLOXACIN 500 MG PO TABS: 500.0000 mg | ORAL_TABLET | Freq: Every day | ORAL | Status: DC

## 2015-11-05 MED FILL — levoFLOXacin 500 MG TABS: 500 | 10 days supply | Qty: 10 | Fill #0

## 2015-11-05 NOTE — Assessment & Plan Note (Signed)
Mild jan 20, for f/u today to assess for improvement Lab Results  Component Value Date   WBC 12.0* 11/01/2015   HGB 14.7 11/01/2015   HCT 43.9 11/01/2015   MCV 97.3 11/01/2015   PLT 206 11/01/2015

## 2015-11-05 NOTE — Assessment & Plan Note (Signed)
Ok to continue the zyrtec

## 2015-11-05 NOTE — Progress Notes (Signed)
Subjective:    Patient ID: Julian Cook, male    DOB: 1976/09/14, 40 y.o.   MRN: [id number]  HPI  Here with acute onset mild to mod 2 wks ST, HA, general weakness and malaise, with prod cough greenish sputum, but Pt denies chest pain, increased sob or doe, wheezing, orthopnea, PND, increased LE swelling, palpitations, dizziness or syncope, except for mild sob improved with inhaler..  Finished zpack, cont's tamiflu, just not feeling better.  Pt denies new neurological symptoms such as new headache, or facial or extremity weakness or numbness   Pt denies polydipsia, polyuria  Does have several wks ongoing nasal allergy symptoms with clearish congestion, itch and sneezing Past Medical History  Diagnosis Date  . Umbilical hernia 02/2012  . Headache(784.0)     once/week  . Seasonal allergies   . Depression     no current meds.  . Anxiety   . Low back pain    Past Surgical History  Procedure Laterality Date  . Umbilical hernia repair  03/10/2012    Procedure: HERNIA REPAIR UMBILICAL ADULT;  Surgeon: Atilano Ina, MD,FACS;  Location: [facility];  Service: General;  Laterality: N/A;    reports that he has quit smoking. His smoking use included Cigarettes. He has a .5 pack-year smoking history. He has never used smokeless tobacco. He reports that he does not drink alcohol or use illicit drugs. family history includes Multiple sclerosis in his mother; Seizures in his mother. Allergies  Allergen Reactions  . Fish Allergy Anaphylaxis  . Shellfish Allergy Shortness Of Breath and Swelling  . Imipramine Rash   Current Outpatient Prescriptions on File Prior to Visit  Medication Sig Dispense Refill  . azithromycin (ZITHROMAX Z-PAK) 250 MG tablet 2 po day one, then 1 daily x 4 days 5 tablet 0  . cetirizine (ZYRTEC) 10 MG tablet Take 10 mg by mouth daily.    Marland Kitchen EPINEPHrine 0.3 mg/0.3 mL IJ SOAJ injection Inject 0.3 mLs (0.3 mg total) into the muscle daily as needed (for allergic  reactions). 1 Device 1  . fenofibrate (TRICOR) 48 MG tablet Take 1 tablet (48 mg total) by mouth daily. 90 tablet 2  . HYDROcodone-acetaminophen (NORCO/VICODIN) 5-325 MG tablet Take 1-2 tablets by mouth every 6 hours as needed for pain and/or cough. 7 tablet 0  . Multiple Vitamin (MULTIVITAMIN WITH MINERALS) TABS Take 1 tablet by mouth daily.    Marland Kitchen oseltamivir (TAMIFLU) 75 MG capsule Take 1 capsule (75 mg total) by mouth every 12 (twelve) hours. 10 capsule 0  . Phenyleph-CPM-DM-Aspirin (ALKA-SELTZER PLUS COLD & COUGH PO) Take 1 tablet by mouth 2 (two) times daily as needed (cough and cold).     No current facility-administered medications on file prior to visit.   Review of Systems  Constitutional: Negative for unusual diaphoresis or night sweats HENT: Negative for ringing in ear or discharge Eyes: Negative for double vision or worsening visual disturbance.  Respiratory: Negative for choking and stridor.   Gastrointestinal: Negative for vomiting or other signifcant bowel change Genitourinary: Negative for hematuria or change in urine volume.  Musculoskeletal: Negative for other MSK pain or swelling Skin: Negative for color change and worsening wound.  Neurological: Negative for tremors and numbness other than noted  Psychiatric/Behavioral: Negative for decreased concentration or agitation other than above       Objective:   Physical Exam BP 136/88 mmHg  Pulse 98  Temp(Src) 98.6 F (37 C) (Oral)  Resp 20  Wt 250 lb  4 oz (113.513 kg)  SpO2 97% VS noted, mild ill Constitutional: Pt appears in no significant distress HENT: Head: NCAT.  Right Ear: External ear normal.  Left Ear: External ear normal.  Eyes: . Pupils are equal, round, and reactive to light. Conjunctivae and EOM are normal Neck: Normal range of motion. Neck supple.  Cardiovascular: Normal rate and regular rhythm.   Pulmonary/Chest: Effort normal and breath sounds without rales or wheezing.  Neurological: Pt is alert.  Not confused , motor grossly intact Skin: Skin is warm. No rash, no LE edema Psychiatric: Pt behavior is normal. No agitation.   DG Chest 2 View   Status: Final result       PACS Images     Show images for DG Chest 2 View     Study Result     CLINICAL DATA: Cough  EXAM: CHEST 2 VIEW  COMPARISON: 09/03/2015 chest radiograph.  FINDINGS: Stable cardiomediastinal silhouette with normal heart size. No pneumothorax. No pleural effusion. There is patchy opacity in both lower lobes. No pulmonary edema.  IMPRESSION: Patchy opacity in both lower lobes, suspicious for multifocal pneumonia. Recommend follow-up PA and lateral post treatment chest radiographs in 6-8 weeks.   Electronically Signed  By: Delbert Phenix M.D.  On: 11/01/2015 08:03       Assessment & Plan:

## 2015-11-05 NOTE — Progress Notes (Signed)
Pre visit review using our clinic review tool, if applicable. No additional management support is needed unless otherwise documented below in the visit note. 

## 2015-11-05 NOTE — Assessment & Plan Note (Signed)
bilat bibasilar, not clear if improved, finished zpack and has only 2 days tamiflu left, still with mild prod cough, and sob only slightly improved with inhaler - for add levaquin asd, cxr f/u and cbc

## 2015-11-05 NOTE — Patient Instructions (Signed)
Please take all new medication as prescribed - the antibiotic  Please continue all other medications as before, including finishing the tamiflu, and the inhaler use  Please have the pharmacy call with any other refills you may need.  Please keep your appointments with your specialists as you may have planned  Please go to the XRAY Department in the Basement (go straight as you get off the elevator) for the x-ray testing  Please go to the LAB in the Basement (turn left off the elevator) for the tests to be done today - just the CBC today  You will be contacted by phone if any changes need to be made immediately.  Otherwise, you will receive a letter about your results with an explanation, but please check with MyChart first.  Please remember to sign up for MyChart if you have not done so, as this will be important to you in the future with finding out test results, communicating by private email, and scheduling acute appointments online when needed.

## 2015-11-06 LAB — CULTURE, BLOOD (ROUTINE X 2)
CULTURE: NO GROWTH
Culture: NO GROWTH

## 2015-11-20 ENCOUNTER — Ambulatory Visit (INDEPENDENT_AMBULATORY_CARE_PROVIDER_SITE_OTHER): Payer: 59 | Admitting: Internal Medicine

## 2015-11-20 ENCOUNTER — Encounter: Payer: Self-pay | Admitting: Internal Medicine

## 2015-11-20 VITALS — BP 120/72 | HR 94 | Temp 99.0°F | Resp 20 | Wt 251.0 lb

## 2015-11-20 DIAGNOSIS — J019 Acute sinusitis, unspecified: Secondary | ICD-10-CM

## 2015-11-20 DIAGNOSIS — J189 Pneumonia, unspecified organism: Secondary | ICD-10-CM | POA: Diagnosis not present

## 2015-11-20 DIAGNOSIS — J309 Allergic rhinitis, unspecified: Secondary | ICD-10-CM | POA: Diagnosis not present

## 2015-11-20 MED ORDER — MONTELUKAST SODIUM 10 MG PO TABS: 10.0000 mg | ORAL_TABLET | Freq: Every day | ORAL | Status: AC

## 2015-11-20 MED ORDER — AZITHROMYCIN 250 MG PO TABS: ORAL_TABLET | ORAL | Status: DC

## 2015-11-20 MED FILL — MONTELUKAST SOD 10 MG TAB: 10 | 30 days supply | Qty: 30 | Fill #0

## 2015-11-20 MED FILL — AZITHROMYCIN 250 MG TABLET: 250 | 5 days supply | Qty: 6 | Fill #0

## 2015-11-20 NOTE — Progress Notes (Signed)
Pre visit review using our clinic review tool, if applicable. No additional management support is needed unless otherwise documented below in the visit note. 

## 2015-11-20 NOTE — Patient Instructions (Addendum)
Please take all new medication as prescribed - the antibiotic, and the generic singulair  You can also take Delsym OTC for cough, and/or Mucinex (or it's generic off brand) for congestion, and tylenol as needed for pain.  Please continue all other medications as before, and refills have been done if requested.  Please have the pharmacy call with any other refills you may need.  Please keep your appointments with your specialists as you may have planned

## 2015-11-20 NOTE — Progress Notes (Signed)
Subjective:    Patient ID: Julian Cook, male    DOB: Mar 21, 1976, 40 y.o.   MRN: [id number]  HPI  Here with 2-3 days acute onset fever, facial pain, pressure, headache, general weakness and malaise, and greenish d/c, with mild ST and cough, but pt denies chest pain, wheezing, increased sob or doe, orthopnea, PND, increased LE swelling, palpitations, dizziness or syncope.Does have several wks ongoing nasal allergy symptoms with clearish congestion, itch and sneezing, without fever, pain, ST, cough, swelling or wheezing.  S/p recent tx pna, so very concerned but no prod cough, or other noted.   Past Medical History  Diagnosis Date  . Umbilical hernia 02/2012  . Headache(784.0)     once/week  . Seasonal allergies   . Depression     no current meds.  . Anxiety   . Low back pain    Past Surgical History  Procedure Laterality Date  . Umbilical hernia repair  03/10/2012    Procedure: HERNIA REPAIR UMBILICAL ADULT;  Surgeon: Atilano Ina, MD,FACS;  Location: [facility];  Service: General;  Laterality: N/A;    reports that he has quit smoking. His smoking use included Cigarettes. He has a .5 pack-year smoking history. He has never used smokeless tobacco. He reports that he does not drink alcohol or use illicit drugs. family history includes Multiple sclerosis in his mother; Seizures in his mother. Allergies  Allergen Reactions  . Fish Allergy Anaphylaxis  . Shellfish Allergy Shortness Of Breath and Swelling  . Imipramine Rash   Current Outpatient Prescriptions on File Prior to Visit  Medication Sig Dispense Refill  . cetirizine (ZYRTEC) 10 MG tablet Take 10 mg by mouth daily.    Marland Kitchen EPINEPHrine 0.3 mg/0.3 mL IJ SOAJ injection Inject 0.3 mLs (0.3 mg total) into the muscle daily as needed (for allergic reactions). 1 Device 1  . fenofibrate (TRICOR) 48 MG tablet Take 1 tablet (48 mg total) by mouth daily. 90 tablet 2  . HYDROcodone-acetaminophen (NORCO/VICODIN) 5-325 MG tablet  Take 1-2 tablets by mouth every 6 hours as needed for pain and/or cough. 7 tablet 0  . Multiple Vitamin (MULTIVITAMIN WITH MINERALS) TABS Take 1 tablet by mouth daily.    Marland Kitchen Phenyleph-CPM-DM-Aspirin (ALKA-SELTZER PLUS COLD & COUGH PO) Take 1 tablet by mouth 2 (two) times daily as needed (cough and cold).     No current facility-administered medications on file prior to visit.   Review of Systems  Constitutional: Negative for unusual diaphoresis or night sweats HENT: Negative for ringing in ear or discharge Eyes: Negative for double vision or worsening visual disturbance.  Respiratory: Negative for choking and stridor.   Gastrointestinal: Negative for vomiting or other signifcant bowel change Genitourinary: Negative for hematuria or change in urine volume.  Musculoskeletal: Negative for other MSK pain or swelling Skin: Negative for color change and worsening wound.  Neurological: Negative for tremors and numbness other than noted  Psychiatric/Behavioral: Negative for decreased concentration or agitation other than above       Objective:   Physical Exam BP 120/72 mmHg  Pulse 94  Temp(Src) 99 F (37.2 C) (Oral)  Resp 20  Wt 251 lb (113.853 kg)  SpO2 95%  VS noted, mild ill Constitutional: Pt appears in no significant distress HENT: Head: NCAT.  Right Ear: External ear normal.  Left Ear: External ear normal.  Bilat tm's with mild erythema.  Max sinus areas mild tender.  Pharynx with mild erythema, no exudate Eyes: . Pupils are equal,  round, and reactive to light. Conjunctivae and EOM are normal Neck: Normal range of motion. Neck supple.  Cardiovascular: Normal rate and regular rhythm.   Pulmonary/Chest: Effort normal and breath sounds without rales or wheezing. but with somewhat decreased left base Neurological: Pt is alert. Not confused , motor grossly intact Skin: Skin is warm. No rash, no LE edema Psychiatric: Pt behavior is normal. No agitation.      Assessment & Plan:

## 2015-11-21 ENCOUNTER — Encounter (HOSPITAL_COMMUNITY): Payer: Self-pay | Admitting: Emergency Medicine

## 2015-11-21 ENCOUNTER — Ambulatory Visit: Payer: 59 | Admitting: Family Medicine

## 2015-11-21 ENCOUNTER — Emergency Department (INDEPENDENT_AMBULATORY_CARE_PROVIDER_SITE_OTHER): Payer: 59

## 2015-11-21 ENCOUNTER — Emergency Department (INDEPENDENT_AMBULATORY_CARE_PROVIDER_SITE_OTHER)
Admission: EM | Admit: 2015-11-21 | Discharge: 2015-11-21 | Disposition: A | Discharge status: Home / Self Care | Payer: 59 | Source: Home / Self Care | Attending: Family Medicine | Admitting: Family Medicine

## 2015-11-21 DIAGNOSIS — R05 Cough: Secondary | ICD-10-CM

## 2015-11-21 DIAGNOSIS — R059 Cough, unspecified: Secondary | ICD-10-CM

## 2015-11-21 DIAGNOSIS — R0989 Other specified symptoms and signs involving the circulatory and respiratory systems: Secondary | ICD-10-CM | POA: Diagnosis not present

## 2015-11-21 MED ORDER — HYDROCOD POLST-CPM POLST ER 10-8 MG/5ML PO SUER: 5.0000 mL | Freq: Two times a day (BID) | ORAL | Status: DC

## 2015-11-21 MED ORDER — BENZONATATE 100 MG PO CAPS: 100.0000 mg | ORAL_CAPSULE | Freq: Three times a day (TID) | ORAL | Status: DC

## 2015-11-21 MED ORDER — HYDROCODONE-HOMATROPINE 5-1.5 MG/5ML PO SYRP: 5.0000 mL | ORAL_SOLUTION | Freq: Four times a day (QID) | ORAL | Status: DC | PRN

## 2015-11-21 NOTE — ED Notes (Signed)
Patient reports having pneumonia and flu 2 weeks ago.  Patient saw pcp yesterday for congestion in head.  Patient reports feeling worse today.  Patient was prescribed his 3rd antibiotic yesterday.  Patient feels worse:elephant on chest, ears hurting, throat hurting.

## 2015-11-21 NOTE — Discharge Instructions (Signed)

## 2015-11-21 NOTE — ED Provider Notes (Signed)
CSN: [id number]     Arrival date & time 11/21/15  1455 History   First MD Initiated Contact with Patient 11/21/15 1708     Chief Complaint  Patient presents with  . URI   (Consider location/radiation/quality/duration/timing/severity/associated sxs/prior Treatment) Patient is a 40 y.o. male presenting with URI. The history is provided by the patient.  URI Presenting symptoms: congestion and cough   Severity:  Moderate Onset quality:  Gradual Duration:  4 weeks Timing:  Constant Progression:  Worsening Chronicity:  Recurrent Relieved by:  Nothing Worsened by:  Nothing tried Ineffective treatments:  None tried Associated symptoms: no sinus pain   Risk factors: no chronic respiratory disease and no sick contacts     Past Medical History  Diagnosis Date  . Umbilical hernia 02/2012  . Headache(784.0)     once/week  . Seasonal allergies   . Depression     no current meds.  . Anxiety   . Low back pain    Past Surgical History  Procedure Laterality Date  . Umbilical hernia repair  03/10/2012    Procedure: HERNIA REPAIR UMBILICAL ADULT;  Surgeon: Atilano Ina, MD,FACS;  Location: [facility];  Service: General;  Laterality: N/A;   Family History  Problem Relation Age of Onset  . Multiple sclerosis Mother   . Seizures Mother    Social History  Substance Use Topics  . Smoking status: Former Smoker -- 0.50 packs/day for 1 years    Types: Cigarettes  . Smokeless tobacco: Never Used     Comment: quit smoking years ago  . Alcohol Use: No    Review of Systems  HENT: Positive for congestion.   Respiratory: Positive for cough.   All other systems reviewed and are negative.   Allergies  Fish allergy; Shellfish allergy; and Imipramine  Home Medications   Prior to Admission medications   Medication Sig Start Date End Date Taking? Authorizing Provider  azithromycin (ZITHROMAX Z-PAK) 250 MG tablet Use as directed 11/20/15   Corwin Levins, MD  cetirizine (ZYRTEC)  10 MG tablet Take 10 mg by mouth daily.    Historical Provider, MD  EPINEPHrine 0.3 mg/0.3 mL IJ SOAJ injection Inject 0.3 mLs (0.3 mg total) into the muscle daily as needed (for allergic reactions). 08/08/15   Veryl Speak, FNP  fenofibrate (TRICOR) 48 MG tablet Take 1 tablet (48 mg total) by mouth daily. 09/06/15   Veryl Speak, FNP  HYDROcodone-acetaminophen (NORCO/VICODIN) 5-325 MG tablet Take 1-2 tablets by mouth every 6 hours as needed for pain and/or cough. 11/01/15   Nicole Pisciotta, PA-C  montelukast (SINGULAIR) 10 MG tablet Take 1 tablet (10 mg total) by mouth daily. 11/20/15   Corwin Levins, MD  Multiple Vitamin (MULTIVITAMIN WITH MINERALS) TABS Take 1 tablet by mouth daily.    Historical Provider, MD  Phenyleph-CPM-DM-Aspirin (ALKA-SELTZER PLUS COLD & COUGH PO) Take 1 tablet by mouth 2 (two) times daily as needed (cough and cold).    Historical Provider, MD   Meds Ordered and Administered this Visit  Medications - No data to display  BP 111/83 mmHg  Pulse 82  Temp(Src) 97.3 F (36.3 C) (Oral)  Resp 16  SpO2 96% No data found.   Physical Exam  Constitutional: He is oriented to person, place, and time. He appears well-developed and well-nourished.  HENT:  Head: Normocephalic.  Right Ear: External ear normal.  Left Ear: External ear normal.  Nose: Nose normal.  Mouth/Throat: Oropharynx is clear and moist.  Eyes: Pupils are equal, round, and reactive to light.  Neck: Normal range of motion.  Cardiovascular: Normal rate.   Pulmonary/Chest: Effort normal.  Abdominal: Soft.  Neurological: He is alert and oriented to person, place, and time. He has normal reflexes.  Skin: Skin is warm.  Psychiatric: He has a normal mood and affect.  Nursing note and vitals reviewed.   ED Course  Procedures (including critical care time)  Labs Review Labs Reviewed - No data to display  Imaging Review Dg Chest 2 View  11/21/2015  CLINICAL DATA:  Two weeks of chest congestion EXAM:  CHEST  2 VIEW COMPARISON:  November 05, 2015 FINDINGS: The heart size and mediastinal contours are within normal limits. There is no focal infiltrate, pulmonary edema, or pleural effusion. The visualized skeletal structures are unremarkable. IMPRESSION: No active cardiopulmonary disease. Electronically Signed   By: Sherian Rein M.D.   On: 11/21/2015 17:53     Visual Acuity Review  Right Eye Distance:   Left Eye Distance:   Bilateral Distance:    Right Eye Near:   Left Eye Near:    Bilateral Near:         MDM no pneumonia   1. Cough    Pt advised to finish the antibiotic he is on.   Pt given rx for tussionex for cough.      Lonia Skinner [place], PA-C 11/21/15 1827

## 2015-11-24 NOTE — Assessment & Plan Note (Signed)
Mild to mod, for antibx course,  to f/u any worsening symptoms or concerns 

## 2015-11-24 NOTE — Assessment & Plan Note (Signed)
BS somewhat decreased left base today, declines cxr,  Continue to monitor for worsening s/s

## 2015-11-24 NOTE — Assessment & Plan Note (Signed)
/  Mild to mod, for add singulair 10 qd prn,  to f/u any worsening symptoms or concerns

## 2015-12-05 ENCOUNTER — Encounter: Payer: Self-pay | Admitting: Internal Medicine

## 2015-12-05 ENCOUNTER — Ambulatory Visit (INDEPENDENT_AMBULATORY_CARE_PROVIDER_SITE_OTHER): Payer: 59 | Admitting: Internal Medicine

## 2015-12-05 VITALS — BP 122/78 | HR 93 | Temp 98.0°F | Resp 20 | Wt 250.0 lb

## 2015-12-05 DIAGNOSIS — J069 Acute upper respiratory infection, unspecified: Secondary | ICD-10-CM | POA: Diagnosis not present

## 2015-12-05 DIAGNOSIS — J189 Pneumonia, unspecified organism: Secondary | ICD-10-CM | POA: Diagnosis not present

## 2015-12-05 DIAGNOSIS — H9201 Otalgia, right ear: Secondary | ICD-10-CM | POA: Diagnosis not present

## 2015-12-05 MED ORDER — TRAMADOL HCL 50 MG PO TABS: 50.0000 mg | ORAL_TABLET | Freq: Three times a day (TID) | ORAL | Status: DC | PRN

## 2015-12-05 MED ORDER — NEOMYCIN-POLYMYXIN-HC 1 % OT SOLN: 3.0000 [drp] | Freq: Four times a day (QID) | OTIC | Status: DC

## 2015-12-05 MED ORDER — LEVOFLOXACIN 500 MG PO TABS: 500.0000 mg | ORAL_TABLET | Freq: Every day | ORAL | Status: DC

## 2015-12-05 NOTE — Patient Instructions (Signed)
Please take all new medication as prescribed - the antibiotic, pain medication, and ear drops  You will be contacted regarding the referral for: ENT (ear, nose and throat)  Please continue all other medications as before, and refills have been done if requested.  Please have the pharmacy call with any other refills you may need.  Please keep your appointments with your specialists as you may have planned

## 2015-12-05 NOTE — Progress Notes (Signed)
Pre visit review using our clinic review tool, if applicable. No additional management support is needed unless otherwise documented below in the visit note. 

## 2015-12-06 DIAGNOSIS — J069 Acute upper respiratory infection, unspecified: Secondary | ICD-10-CM | POA: Insufficient documentation

## 2015-12-06 MED FILL — traMADol HCL 50 MG TABS: 50 | 13 days supply | Qty: 50 | Fill #0

## 2015-12-06 MED FILL — levoFLOXacin 500 MG TABS: 500 | 10 days supply | Qty: 10 | Fill #0

## 2015-12-06 MED FILL — NEO/POLYMYXIN/HC EAR SOLN: 1 | 8 days supply | Qty: 10 | Fill #0

## 2015-12-06 NOTE — Assessment & Plan Note (Signed)
Worsening in last few days assoc with right ext canal, Mild to mod, for topical antibx course,  to f/u any worsening symptoms or concerns

## 2015-12-06 NOTE — Progress Notes (Signed)
Subjective:    Patient ID: Julian Cook, male    DOB: 08-10-1976, 40 y.o.   MRN: [id number]  HPI  Here to f/u after some improvement last visit, now worsened again primarily with right ear pain, and facial pain, pressure, headache, general weakness and malaise, with mild ST and cough, but pt denies chest pain, wheezing, increased sob or doe, orthopnea, PND, increased LE swelling, palpitations, dizziness or syncope.  Pt denies new neurological symptoms such as new headache, or facial or extremity weakness or numbness   Pt denies polydipsia, polyuria. Past Medical History  Diagnosis Date  . Umbilical hernia 02/2012  . Headache(784.0)     once/week  . Seasonal allergies   . Depression     no current meds.  . Anxiety   . Low back pain    Past Surgical History  Procedure Laterality Date  . Umbilical hernia repair  03/10/2012    Procedure: HERNIA REPAIR UMBILICAL ADULT;  Surgeon: Atilano Ina, MD,FACS;  Location: [facility];  Service: General;  Laterality: N/A;    reports that he has quit smoking. His smoking use included Cigarettes. He has a .5 pack-year smoking history. He has never used smokeless tobacco. He reports that he does not drink alcohol or use illicit drugs. family history includes Multiple sclerosis in his mother; Seizures in his mother. Allergies  Allergen Reactions  . Fish Allergy Anaphylaxis  . Shellfish Allergy Shortness Of Breath and Swelling  . Imipramine Rash   Current Outpatient Prescriptions on File Prior to Visit  Medication Sig Dispense Refill  . cetirizine (ZYRTEC) 10 MG tablet Take 10 mg by mouth daily.    Marland Kitchen EPINEPHrine 0.3 mg/0.3 mL IJ SOAJ injection Inject 0.3 mLs (0.3 mg total) into the muscle daily as needed (for allergic reactions). 1 Device 1  . fenofibrate (TRICOR) 48 MG tablet Take 1 tablet (48 mg total) by mouth daily. 90 tablet 2  . HYDROcodone-acetaminophen (NORCO/VICODIN) 5-325 MG tablet Take 1-2 tablets by mouth every 6 hours as  needed for pain and/or cough. 7 tablet 0  . montelukast (SINGULAIR) 10 MG tablet Take 1 tablet (10 mg total) by mouth daily. 30 tablet 11  . Multiple Vitamin (MULTIVITAMIN WITH MINERALS) TABS Take 1 tablet by mouth daily.    Marland Kitchen Phenyleph-CPM-DM-Aspirin (ALKA-SELTZER PLUS COLD & COUGH PO) Take 1 tablet by mouth 2 (two) times daily as needed (cough and cold).     No current facility-administered medications on file prior to visit.   Review of Systems  Constitutional: Negative for unusual diaphoresis or night sweats HENT: Negative for ringing in ear or discharge Eyes: Negative for double vision or worsening visual disturbance.  Respiratory: Negative for choking and stridor.   Gastrointestinal: Negative for vomiting or other signifcant bowel change Genitourinary: Negative for hematuria or change in urine volume.  Musculoskeletal: Negative for other MSK pain or swelling Skin: Negative for color change and worsening wound.  Neurological: Negative for tremors and numbness other than noted  Psychiatric/Behavioral: Negative for decreased concentration or agitation other than above       Objective:   Physical Exam BP 122/78 mmHg  Pulse 93  Temp(Src) 98 F (36.7 C) (Oral)  Resp 20  Wt 250 lb (113.399 kg)  SpO2 96% VS noted, mild ill Constitutional: Pt appears in no significant distress HENT: Head: NCAT.  Right Ear: External ear normal.  Left Ear: External ear normal.  Eyes: . Pupils are equal, round, and reactive to light. Conjunctivae and EOM  are normal Bilat tm's with mod erythema right > left,  Max sinus areas mild tender.  Pharynx with mild erythema, no exudate; also + mild right ext canal erythema/swelling Neck: Normal range of motion. Neck supple.  Cardiovascular: Normal rate and regular rhythm.   Pulmonary/Chest: Effort normal and breath sounds without rales or wheezing.  Neurological: Pt is alert. Not confused , motor grossly intact Skin: Skin is warm. No rash, no LE  edema Psychiatric: Pt behavior is normal. No agitation.     Assessment & Plan:

## 2015-12-06 NOTE — Assessment & Plan Note (Signed)
No evidence for recurrence,  to f/u any worsening symptoms or concerns

## 2015-12-06 NOTE — Assessment & Plan Note (Signed)
Mild to mod, for antibx course,  to f/u any worsening symptoms or concerns 

## 2015-12-11 ENCOUNTER — Encounter (HOSPITAL_COMMUNITY): Payer: Self-pay | Admitting: *Deleted

## 2015-12-11 ENCOUNTER — Emergency Department (HOSPITAL_COMMUNITY)
Admission: EM | Admit: 2015-12-11 | Discharge: 2015-12-11 | Disposition: A | Discharge status: Home / Self Care | Payer: 59 | Attending: Physician Assistant | Admitting: Physician Assistant

## 2015-12-11 DIAGNOSIS — Z79899 Other long term (current) drug therapy: Secondary | ICD-10-CM | POA: Diagnosis not present

## 2015-12-11 DIAGNOSIS — F329 Major depressive disorder, single episode, unspecified: Secondary | ICD-10-CM | POA: Diagnosis not present

## 2015-12-11 DIAGNOSIS — F419 Anxiety disorder, unspecified: Secondary | ICD-10-CM | POA: Diagnosis not present

## 2015-12-11 DIAGNOSIS — H6591 Unspecified nonsuppurative otitis media, right ear: Secondary | ICD-10-CM

## 2015-12-11 DIAGNOSIS — H9201 Otalgia, right ear: Secondary | ICD-10-CM | POA: Diagnosis not present

## 2015-12-11 DIAGNOSIS — H748X1 Other specified disorders of right middle ear and mastoid: Secondary | ICD-10-CM | POA: Diagnosis not present

## 2015-12-11 DIAGNOSIS — R42 Dizziness and giddiness: Secondary | ICD-10-CM | POA: Insufficient documentation

## 2015-12-11 DIAGNOSIS — Z87891 Personal history of nicotine dependence: Secondary | ICD-10-CM | POA: Insufficient documentation

## 2015-12-11 MED ORDER — TRAMADOL HCL 50 MG PO TABS
50.0000 mg | ORAL_TABLET | Freq: Once | ORAL | Status: AC
Start: 2015-12-11 — End: 2015-12-11
  Administered 2015-12-11: 50 mg via ORAL
  Filled 2015-12-11: qty 1

## 2015-12-11 NOTE — ED Notes (Signed)
Pt reports he works in [employer], community and started to get dizzy. Pt has seen DR Melvyn Novas his PCP and is taking antoibx.  And ultram . Pt has an appt with ENT tomorrow .

## 2015-12-11 NOTE — ED Notes (Signed)
Declined W/C at D/C and was escorted to lobby by RN. 

## 2015-12-11 NOTE — Discharge Instructions (Signed)
Serous Otitis Media Serous otitis media is fluid in the middle ear space. This space contains the bones for hearing and air. Air in the middle ear space helps to transmit sound.  The air gets there through the eustachian tube. This tube goes from the back of the nose (nasopharynx) to the middle ear space. It keeps the pressure in the middle ear the same as the outside world. It also helps to drain fluid from the middle ear space. CAUSES  Serous otitis media occurs when the eustachian tube gets blocked. Blockage can come from:  Ear infections.  Colds and other upper respiratory infections.  Allergies.  Irritants such as cigarette smoke.  Sudden changes in air pressure (such as descending in an airplane).  Enlarged adenoids.  A mass in the nasopharynx. During colds and upper respiratory infections, the middle ear space can become temporarily filled with fluid. This can happen after an ear infection also. Once the infection clears, the fluid will generally drain out of the ear through the eustachian tube. If it does not, then serous otitis media occurs. SIGNS AND SYMPTOMS   Hearing loss.  A feeling of fullness in the ear, without pain.  Young children may not show any symptoms but may show slight behavioral changes, such as agitation, ear pulling, or crying. DIAGNOSIS  Serous otitis media is diagnosed by an ear exam. Tests may be done to check on the movement of the eardrum. Hearing exams may also be done. TREATMENT  The fluid most often goes away without treatment. If allergy is the cause, allergy treatment may be helpful. Fluid that persists for several months may require minor surgery. A small tube is placed in the eardrum to:  Drain the fluid.  Restore the air in the middle ear space. In certain situations, antibiotic medicines are used to avoid surgery. Surgery may be done to remove enlarged adenoids (if this is the cause). HOME CARE INSTRUCTIONS   Keep children away from  tobacco smoke.  Keep all follow-up visits as directed by your health care provider. SEEK MEDICAL CARE IF:   Your hearing is not better in 3 months.  Your hearing is worse.  You have ear pain.  You have drainage from the ear.  You have dizziness.  You have serous otitis media only in one ear or have any bleeding from your nose (epistaxis).  You notice a lump on your neck. MAKE SURE YOU:  Understand these instructions.   Will watch your condition.   Will get help right away if you are not doing well or get worse.    This information is not intended to replace advice given to you by your health care provider. Make sure you discuss any questions you have with your health care provider.   Document Released: 12/19/2003 Document Revised: 10/19/2014 Document Reviewed: 04/25/2013 Elsevier Interactive Patient Education 2016 Else[employer] Inc[employer]  Dizziness Dizziness is a common problem. It is a feeling of unsteadiness or light-headedness. You may feel like you are about to faint. Dizziness can lead to injury if you stumble or fall. Anyone can become dizzy, but dizziness is more common in older adults. This condition can be caused by a number of things, including medicines, dehydration, or illness. HOME CARE INSTRUCTIONS Taking these steps may help with your condition: Eating and Drinking  Drink enough fluid to keep your urine clear or pale yellow. This helps to keep you from becoming dehydrated. Try to drink more clear fluids, such as water.  Do not drink  alcohol.  Limit your caffeine intake if directed by your health care provider.  Limit your salt intake if directed by your health care provider. Activity  Avoid making quick movements.  Rise slowly from chairs and steady yourself until you feel okay.  In the morning, first sit up on the side of the bed. When you feel okay, stand slowly while you hold onto something until you know that your balance is fine.  Move your legs often  if you need to stand in one place for a long time. Tighten and relax your muscles in your legs while you are standing.  Do not drive or operate heavy machinery if you feel dizzy.  Avoid bending down if you feel dizzy. Place items in your home so that they are easy for you to reach without leaning over. Lifestyle  Do not use any tobacco products, including cigarettes, chewing tobacco, or electronic cigarettes. If you need help quitting, ask your health care provider.  Try to reduce your stress level, such as with yoga or meditation. Talk with your health care provider if you need help. General Instructions  Watch your dizziness for any changes.  Take medicines only as directed by your health care provider. Talk with your health care provider if you think that your dizziness is caused by a medicine that you are taking.  Tell a friend or a family member that you are feeling dizzy. If he or she notices any changes in your behavior, have this person call your health care provider.  Keep all follow-up visits as directed by your health care provider. This is important. SEEK MEDICAL CARE IF:  Your dizziness does not go away.  Your dizziness or light-headedness gets worse.  You feel nauseous.  You have reduced hearing.  You have new symptoms.  You are unsteady on your feet or you feel like the room is spinning. SEEK IMMEDIATE MEDICAL CARE IF:  You vomit or have diarrhea and are unable to eat or drink anything.  You have problems talking, walking, swallowing, or using your arms, hands, or legs.  You feel generally weak.  You are not thinking clearly or you have trouble forming sentences. It may take a friend or family member to notice this.  You have chest pain, abdominal pain, shortness of breath, or sweating.  Your vision changes.  You notice any bleeding.  You have a headache.  You have neck pain or a stiff neck.  You have a fever.   This information is not intended to  replace advice given to you by your health care provider. Make sure you discuss any questions you have with your health care provider.   Document Released: 03/24/2001 Document Revised: 02/12/2015 Document Reviewed: 09/24/2014 Elsevier Interactive Patient Education [employer]

## 2015-12-11 NOTE — ED Provider Notes (Signed)
CSN: [id number]     Arrival date & time 12/11/15  1339 History  By signing my name below, I, Emmanuella Mensah, attest that this documentation has been prepared under the direction and in the presence of  Cheri Fowler, PA-C. Electronically Signed: Angelene Giovanni, ED Scribe. 12/11/2015. 2:12 PM.    Chief Complaint  Patient presents with  . Otalgia  . Dizziness   The history is provided by the patient. No language interpreter was used.   HPI Comments: Julian Cook is a 40 y.o. male who presents to the Emergency Department complaining of an episode of dizziness he describes as "being off-balance" that occurred PTA while at work. He reports associated ear fullness, ear pain, and chest congestion. He adds that he has had multiple episodes of "feeling off balance" for approx. one month.  Pt is being treated for URI and CAP he has had for the past month and is currently on antibiotics (Levaquin) and Tramadol for his right ear pain. He states that he does not take the Tramadol when he comes to work; therefore, he has not had any today. He has an upcoming appointment with an ENT physician tomorrow. He denies any numbness, tingling, headache, chest pain, presyncope, syncope, palpitations, SOB, gait abnormalities, or n/v.     Past Medical History  Diagnosis Date  . Umbilical hernia 02/2012  . Headache(784.0)     once/week  . Seasonal allergies   . Depression     no current meds.  . Anxiety   . Low back pain    Past Surgical History  Procedure Laterality Date  . Umbilical hernia repair  03/10/2012    Procedure: HERNIA REPAIR UMBILICAL ADULT;  Surgeon: Atilano Ina, MD,FACS;  Location: [facility];  Service: General;  Laterality: N/A;   Family History  Problem Relation Age of Onset  . Multiple sclerosis Mother   . Seizures Mother    Social History  Substance Use Topics  . Smoking status: Former Smoker -- 0.50 packs/day for 1 years    Types: Cigarettes  . Smokeless tobacco:  Never Used     Comment: quit smoking years ago  . Alcohol Use: No    Review of Systems  Constitutional: Negative for fever and chills.  HENT: Positive for congestion and ear pain. Negative for ear discharge.   Respiratory: Negative for shortness of breath.   Cardiovascular: Negative for chest pain and palpitations.  Gastrointestinal: Negative for nausea and vomiting.  Neurological: Positive for dizziness. Negative for syncope, weakness, light-headedness, numbness and headaches.  All other systems reviewed and are negative.     Allergies  Fish allergy; Shellfish allergy; and Imipramine  Home Medications   Prior to Admission medications   Medication Sig Start Date End Date Taking? Authorizing Provider  cetirizine (ZYRTEC) 10 MG tablet Take 10 mg by mouth daily.    Historical Provider, MD  EPINEPHrine 0.3 mg/0.3 mL IJ SOAJ injection Inject 0.3 mLs (0.3 mg total) into the muscle daily as needed (for allergic reactions). 08/08/15   Veryl Speak, FNP  fenofibrate (TRICOR) 48 MG tablet Take 1 tablet (48 mg total) by mouth daily. 09/06/15   Veryl Speak, FNP  HYDROcodone-acetaminophen (NORCO/VICODIN) 5-325 MG tablet Take 1-2 tablets by mouth every 6 hours as needed for pain and/or cough. 11/01/15   Nicole Pisciotta, PA-C  levofloxacin (LEVAQUIN) 500 MG tablet Take 1 tablet (500 mg total) by mouth daily. 12/05/15   Corwin Levins, MD  montelukast (SINGULAIR) 10 MG tablet  Take 1 tablet (10 mg total) by mouth daily. 11/20/15   Corwin Levins, MD  Multiple Vitamin (MULTIVITAMIN WITH MINERALS) TABS Take 1 tablet by mouth daily.    Historical Provider, MD  NEOMYCIN-POLYMYXIN-HYDROCORTISONE (CORTISPORIN) 1 % SOLN otic solution Place 3 drops into both ears 4 (four) times daily. 12/05/15   Corwin Levins, MD  Phenyleph-CPM-DM-Aspirin (ALKA-SELTZER PLUS COLD & COUGH PO) Take 1 tablet by mouth 2 (two) times daily as needed (cough and cold).    Historical Provider, MD  traMADol (ULTRAM) 50 MG tablet Take 1  tablet (50 mg total) by mouth every 8 (eight) hours as needed. 12/05/15   Corwin Levins, MD   BP 115/73 mmHg  Pulse 93  Temp(Src) 97.5 F (36.4 C) (Oral)  Resp 18  SpO2 93% Physical Exam  Constitutional: He is oriented to person, place, and time. He appears well-developed and well-nourished.  Non-toxic appearance. He does not have a sickly appearance. He does not appear ill.  HENT:  Head: Normocephalic and atraumatic.  Right Ear: Tympanic membrane normal.  Left Ear: Tympanic membrane normal.  Nose: Nose normal.  Mouth/Throat: Uvula is midline, oropharynx is clear and moist and mucous membranes are normal. No oropharyngeal exudate, posterior oropharyngeal edema, posterior oropharyngeal erythema or tonsillar abscesses.  Possible right middle ear effusion  Eyes: Conjunctivae are normal. Pupils are equal, round, and reactive to light.  Neck: Normal range of motion. Neck supple.  Cardiovascular: Normal rate, regular rhythm and normal heart sounds.   No murmur heard. Pulmonary/Chest: Effort normal and breath sounds normal. No accessory muscle usage or stridor. No respiratory distress. He has no wheezes. He has no rhonchi. He has no rales.  Abdominal: Soft. Bowel sounds are normal. He exhibits no distension. There is no tenderness.  Musculoskeletal: Normal range of motion.  Lymphadenopathy:    He has no cervical adenopathy.  Neurological: He is alert and oriented to person, place, and time.  Mental Status:   AOx3.  Speech clear without dysarthria. Cranial Nerves:  I-not tested  II-PERRLA  III, IV, VI-EOMs intact  V-temporal and masseter strength intact  VII-symmetrical facial movements intact, no facial droop  VIII-hearing grossly intact bilaterally  IX, X-gag intact  XI-strength of sternomastoid and trapezius muscles 5/5  XII-tongue midline Motor:   Good muscle bulk and tone  Strength 5/5 bilaterally in upper and lower extremities   Cerebellar--intact RAMs, finger to nose intact  bilaterally.  Gait normal  No pronator drift Sensory:  Intact in upper and lower extremities   Skin: Skin is warm and dry.  Psychiatric: He has a normal mood and affect. His behavior is normal.    ED Course  Procedures (including critical care time) DIAGNOSTIC STUDIES: Oxygen Saturation is 96% on RA, normal by my interpretation.    COORDINATION OF CARE: 2:08 PM- Pt advised of plan for treatment and pt agrees. Advised to honor appointment tomorrow. Pt will receive a work note.   MDM   Final diagnoses:  Middle ear effusion, right  Right ear pain  Dizziness    VSS, NAD.  Normal neurological exam. Possible right middle ear effusion; otherwise, TM normal. Patient currently taking Singulair and finishing up a course of Levaquin. Patient reports dizziness feels "off balance ". Denies chest pain, palpitations, syncope, presyncope, headache, numbness, weakness. Doubt neurologic or cardiac etiology. Patient is low risk for CVA, doubt stroke. Patient given 50 mg tramadol in ED. Patient to go to ENT appointment tomorrow. Patient advised to continue taking Levaquin and tramadol.  Discussed return precautions. Patient agrees and acknowledges the above plan for discharge.  I personally performed the services described in this documentation, which was scribed in my presence. The recorded information has been reviewed and is accurate.   Cheri Fowler, PA-C 12/11/15 1433  Courteney Randall An, MD 12/11/15 1614

## 2015-12-12 ENCOUNTER — Ambulatory Visit (INDEPENDENT_AMBULATORY_CARE_PROVIDER_SITE_OTHER)
Admission: RE | Admit: 2015-12-12 | Discharge: 2015-12-12 | Disposition: A | Discharge status: Home / Self Care | Payer: 59 | Source: Ambulatory Visit | Attending: Family | Admitting: Family

## 2015-12-12 ENCOUNTER — Other Ambulatory Visit: Payer: Self-pay | Admitting: Family

## 2015-12-12 DIAGNOSIS — R05 Cough: Secondary | ICD-10-CM | POA: Diagnosis not present

## 2015-12-12 DIAGNOSIS — J039 Acute tonsillitis, unspecified: Secondary | ICD-10-CM | POA: Diagnosis not present

## 2015-12-12 DIAGNOSIS — R059 Cough, unspecified: Secondary | ICD-10-CM

## 2015-12-12 DIAGNOSIS — J040 Acute laryngitis: Secondary | ICD-10-CM | POA: Diagnosis not present

## 2015-12-12 DIAGNOSIS — J320 Chronic maxillary sinusitis: Secondary | ICD-10-CM | POA: Diagnosis not present

## 2015-12-12 DIAGNOSIS — H6521 Chronic serous otitis media, right ear: Secondary | ICD-10-CM | POA: Diagnosis not present

## 2015-12-12 DIAGNOSIS — H6691 Otitis media, unspecified, right ear: Secondary | ICD-10-CM | POA: Diagnosis not present

## 2015-12-12 DIAGNOSIS — H70001 Acute mastoiditis without complications, right ear: Secondary | ICD-10-CM | POA: Diagnosis not present

## 2015-12-12 DIAGNOSIS — J322 Chronic ethmoidal sinusitis: Secondary | ICD-10-CM | POA: Diagnosis not present

## 2015-12-12 MED FILL — CLINDAMYCIN HCL 150 MG CAPS: 150 | 10 days supply | Qty: 30 | Fill #0

## 2015-12-13 ENCOUNTER — Telehealth: Payer: Self-pay | Admitting: Family

## 2015-12-13 NOTE — Telephone Encounter (Signed)
Please inform patient that his chest x-ray is normal with no evidence of pneumonia.

## 2015-12-13 NOTE — Telephone Encounter (Signed)
Pt would like to know results of chest xray. CB# [phone]

## 2015-12-13 NOTE — Telephone Encounter (Signed)
LVM letting pt know results. 

## 2015-12-26 ENCOUNTER — Ambulatory Visit (INDEPENDENT_AMBULATORY_CARE_PROVIDER_SITE_OTHER): Payer: 59 | Admitting: Family

## 2015-12-26 ENCOUNTER — Encounter: Payer: Self-pay | Admitting: Family

## 2015-12-26 VITALS — BP 120/70 | HR 107 | Temp 98.0°F | Resp 16 | Ht 70.0 in | Wt 249.0 lb

## 2015-12-26 DIAGNOSIS — F419 Anxiety disorder, unspecified: Principal | ICD-10-CM

## 2015-12-26 DIAGNOSIS — F329 Major depressive disorder, single episode, unspecified: Secondary | ICD-10-CM | POA: Insufficient documentation

## 2015-12-26 DIAGNOSIS — F418 Other specified anxiety disorders: Secondary | ICD-10-CM | POA: Diagnosis not present

## 2015-12-26 DIAGNOSIS — F32A Depression, unspecified: Secondary | ICD-10-CM | POA: Insufficient documentation

## 2015-12-26 MED ORDER — CLONAZEPAM 0.5 MG PO TABS: 0.5000 mg | ORAL_TABLET | Freq: Two times a day (BID) | ORAL | Status: AC | PRN

## 2015-12-26 MED ORDER — SERTRALINE HCL 50 MG PO TABS: 50.0000 mg | ORAL_TABLET | Freq: Every day | ORAL | Status: DC

## 2015-12-26 MED FILL — MONTELUKAST SOD 10 MG TAB: 10 | 90 days supply | Qty: 90 | Fill #1

## 2015-12-26 MED FILL — SERTRALINE HCL 50 MG TABLET: 50 | 90 days supply | Qty: 90 | Fill #0

## 2015-12-26 MED FILL — clonazePAM 0.5 MG TABS: 0.5 | 15 days supply | Qty: 30 | Fill #0

## 2015-12-26 NOTE — Progress Notes (Signed)
Subjective:    Patient ID: Julian Cook, male    DOB: July 10, 1976, 40 y.o.   MRN: [id number]  Chief Complaint  Patient presents with  . Medication Management    was on zoloft years ago, feels like he needs to get back on it bc of some stuff going on, anxiety and panic attacks    HPI:  Julian Cook is a 40 y.o. male who  has a past medical history of Umbilical hernia (02/2012); Headache(784.0); Seasonal allergies; Depression; Anxiety; and Low back pain. and presents today for a follow up office visit.  Panic attacks - Associated symptom of panic attack and increased anxiety has been going on for a couple of weeks secondary to life stressors. His finance has taken his child and moved as well as found out she was pregnant. Notes he used to take Zoloft which did help with symptoms. Also notes that he feels miserable and does not want to get out of bed at times. Denies suicidal ideations.   Allergies  Allergen Reactions  . Fish Allergy Anaphylaxis  . Shellfish Allergy Shortness Of Breath and Swelling  . Imipramine Rash     Current Outpatient Prescriptions on File Prior to Visit  Medication Sig Dispense Refill  . EPINEPHrine 0.3 mg/0.3 mL IJ SOAJ injection Inject 0.3 mLs (0.3 mg total) into the muscle daily as needed (for allergic reactions). 1 Device 1  . fenofibrate (TRICOR) 48 MG tablet Take 1 tablet (48 mg total) by mouth daily. 90 tablet 2  . montelukast (SINGULAIR) 10 MG tablet Take 1 tablet (10 mg total) by mouth daily. 30 tablet 11  . Multiple Vitamin (MULTIVITAMIN WITH MINERALS) TABS Take 1 tablet by mouth daily.     No current facility-administered medications on file prior to visit.      Past Surgical History  Procedure Laterality Date  . Umbilical hernia repair  03/10/2012    Procedure: HERNIA REPAIR UMBILICAL ADULT;  Surgeon: Atilano Ina, MD,FACS;  Location: [facility];  Service: General;  Laterality: N/A;    Review of Systems  Constitutional:  Negative for fever and chills.  Psychiatric/Behavioral: Positive for dysphoric mood. Negative for suicidal ideas, sleep disturbance and self-injury. The patient is nervous/anxious.       Objective:    BP 120/70 mmHg  Pulse 107  Temp(Src) 98 F (36.7 C) (Oral)  Resp 16  Ht  (1.778 m)  Wt 249 lb (112.946 kg)  BMI 35.73 kg/m2  SpO2 95% Nursing note and vital signs reviewed.  Physical Exam  Constitutional: He is oriented to person, place, and time. He appears well-developed and well-nourished. No distress.  Cardiovascular: Normal rate, regular rhythm, normal heart sounds and intact distal pulses.   Pulmonary/Chest: Effort normal and breath sounds normal.  Neurological: He is alert and oriented to person, place, and time.  Skin: Skin is warm and dry.  Psychiatric: His behavior is normal. Judgment and thought content normal. His mood appears anxious. He exhibits a depressed mood.       Assessment & Plan:   Problem List Items Addressed This Visit      Other   Anxiety and depression - Primary    Symptoms consistent with anxiety and depression exacerbated by life stressors. No suicidal ideations. Start Zoloft daily and clonazepam as needed for panic attacks. Follow up in 1 month or sooner if symptoms worsen or do not improve.       Relevant Medications   sertraline (ZOLOFT) 50 MG  tablet   clonazePAM (KLONOPIN) 0.5 MG tablet

## 2015-12-26 NOTE — Patient Instructions (Signed)
Thank you for choosing [facility].  Summary/Instructions:  Please start taking Zoloft daily  Please start taking the clonazepam as needed for anxiety and panic attacks.  Best wishes in the future.   Your prescription(s) have been submitted to your pharmacy or been printed and provided for you. Please take as directed and contact our office if you believe you are having problem(s) with the medication(s) or have any questions.  If your symptoms worsen or fail to improve, please contact our office for further instruction, or in case of emergency go directly to the emergency room at the closest medical facility.

## 2015-12-26 NOTE — Progress Notes (Signed)
Pre visit review using our clinic review tool, if applicable. No additional management support is needed unless otherwise documented below in the visit note. 

## 2015-12-26 NOTE — Assessment & Plan Note (Signed)
Symptoms consistent with anxiety and depression exacerbated by life stressors. No suicidal ideations. Start Zoloft daily and clonazepam as needed for panic attacks. Follow up in 1 month or sooner if symptoms worsen or do not improve.

## 2016-02-10 ENCOUNTER — Encounter: Payer: Self-pay | Admitting: Family

## 2016-02-10 ENCOUNTER — Other Ambulatory Visit: Payer: Self-pay | Admitting: Family

## 2016-02-10 DIAGNOSIS — F419 Anxiety disorder, unspecified: Principal | ICD-10-CM

## 2016-02-10 DIAGNOSIS — F329 Major depressive disorder, single episode, unspecified: Secondary | ICD-10-CM

## 2016-02-11 ENCOUNTER — Other Ambulatory Visit: Payer: Self-pay

## 2016-02-11 DIAGNOSIS — F419 Anxiety disorder, unspecified: Principal | ICD-10-CM

## 2016-02-11 DIAGNOSIS — F32A Depression, unspecified: Secondary | ICD-10-CM

## 2016-02-11 DIAGNOSIS — F329 Major depressive disorder, single episode, unspecified: Secondary | ICD-10-CM

## 2016-02-11 MED ORDER — EPINEPHRINE 0.3 MG/0.3ML IJ SOAJ: 0.3000 mg | Freq: Every day | INTRAMUSCULAR | Status: DC | PRN

## 2016-02-11 MED ORDER — EPINEPHRINE 0.3 MG/0.3ML IJ SOAJ: 0.3000 mg | Freq: Every day | INTRAMUSCULAR | Status: AC | PRN

## 2016-02-11 MED ORDER — SERTRALINE HCL 50 MG PO TABS: 50.0000 mg | ORAL_TABLET | Freq: Every day | ORAL | Status: DC

## 2016-02-11 NOTE — Addendum Note (Signed)
Addended by: Deatra JamesBRAND, Dorota Heinrichs M on: 02/11/2016 10:46 AM   Modules accepted: Orders

## 2016-02-11 NOTE — Telephone Encounter (Signed)
Patient states he is on the way to the pharmacy.

## 2016-02-11 NOTE — Telephone Encounter (Signed)
Scripts did not get sent to correct [facility].  Needs to [facility] [place].  Patient has address in email.  Thanks.

## 2016-04-17 ENCOUNTER — Encounter: Payer: Self-pay | Admitting: Family

## 2016-04-21 MED ORDER — FEXOFENADINE-PSEUDOEPHED ER 180-240 MG PO TB24: 1.0000 | ORAL_TABLET | Freq: Every day | ORAL | Status: AC

## 2016-05-06 ENCOUNTER — Encounter: Payer: Self-pay | Admitting: Family

## 2016-05-06 DIAGNOSIS — F329 Major depressive disorder, single episode, unspecified: Secondary | ICD-10-CM

## 2016-05-06 DIAGNOSIS — F419 Anxiety disorder, unspecified: Principal | ICD-10-CM

## 2016-05-07 MED ORDER — SERTRALINE HCL 50 MG PO TABS: 50.0000 mg | ORAL_TABLET | Freq: Every day | ORAL | 0 refills | Status: DC

## 2016-07-30 ENCOUNTER — Other Ambulatory Visit: Payer: Self-pay | Admitting: Family

## 2016-07-30 DIAGNOSIS — F329 Major depressive disorder, single episode, unspecified: Secondary | ICD-10-CM

## 2016-07-30 DIAGNOSIS — F419 Anxiety disorder, unspecified: Principal | ICD-10-CM

## 2016-07-30 MED ORDER — SERTRALINE HCL 50 MG PO TABS: 50.0000 mg | ORAL_TABLET | Freq: Every day | ORAL | 0 refills | Status: AC

## 2017-08-23 IMAGING — DX DG CHEST 2V
2 series · 2 of 2 positions shown · non-contrast
Comparison: November 05, 2015

CLINICAL DATA: Two weeks of chest congestion

EXAM:
CHEST  2 VIEW

[chest pa]
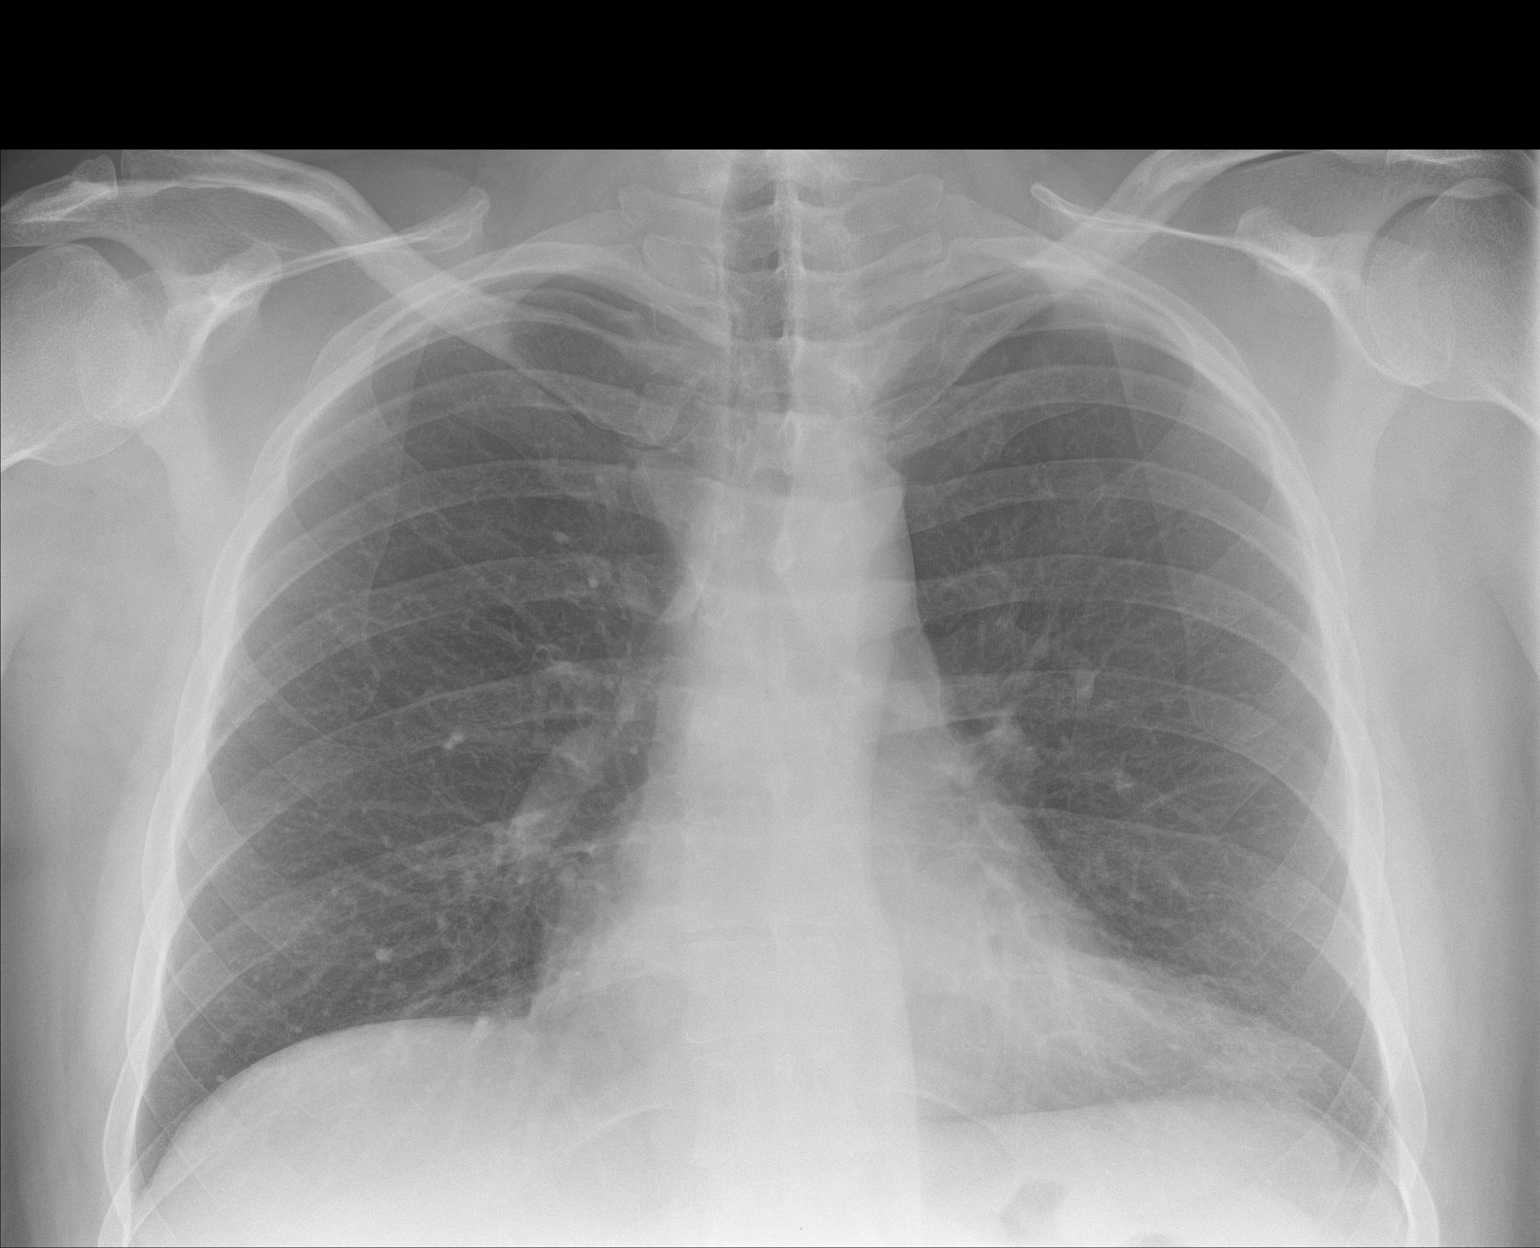

[chest lat]
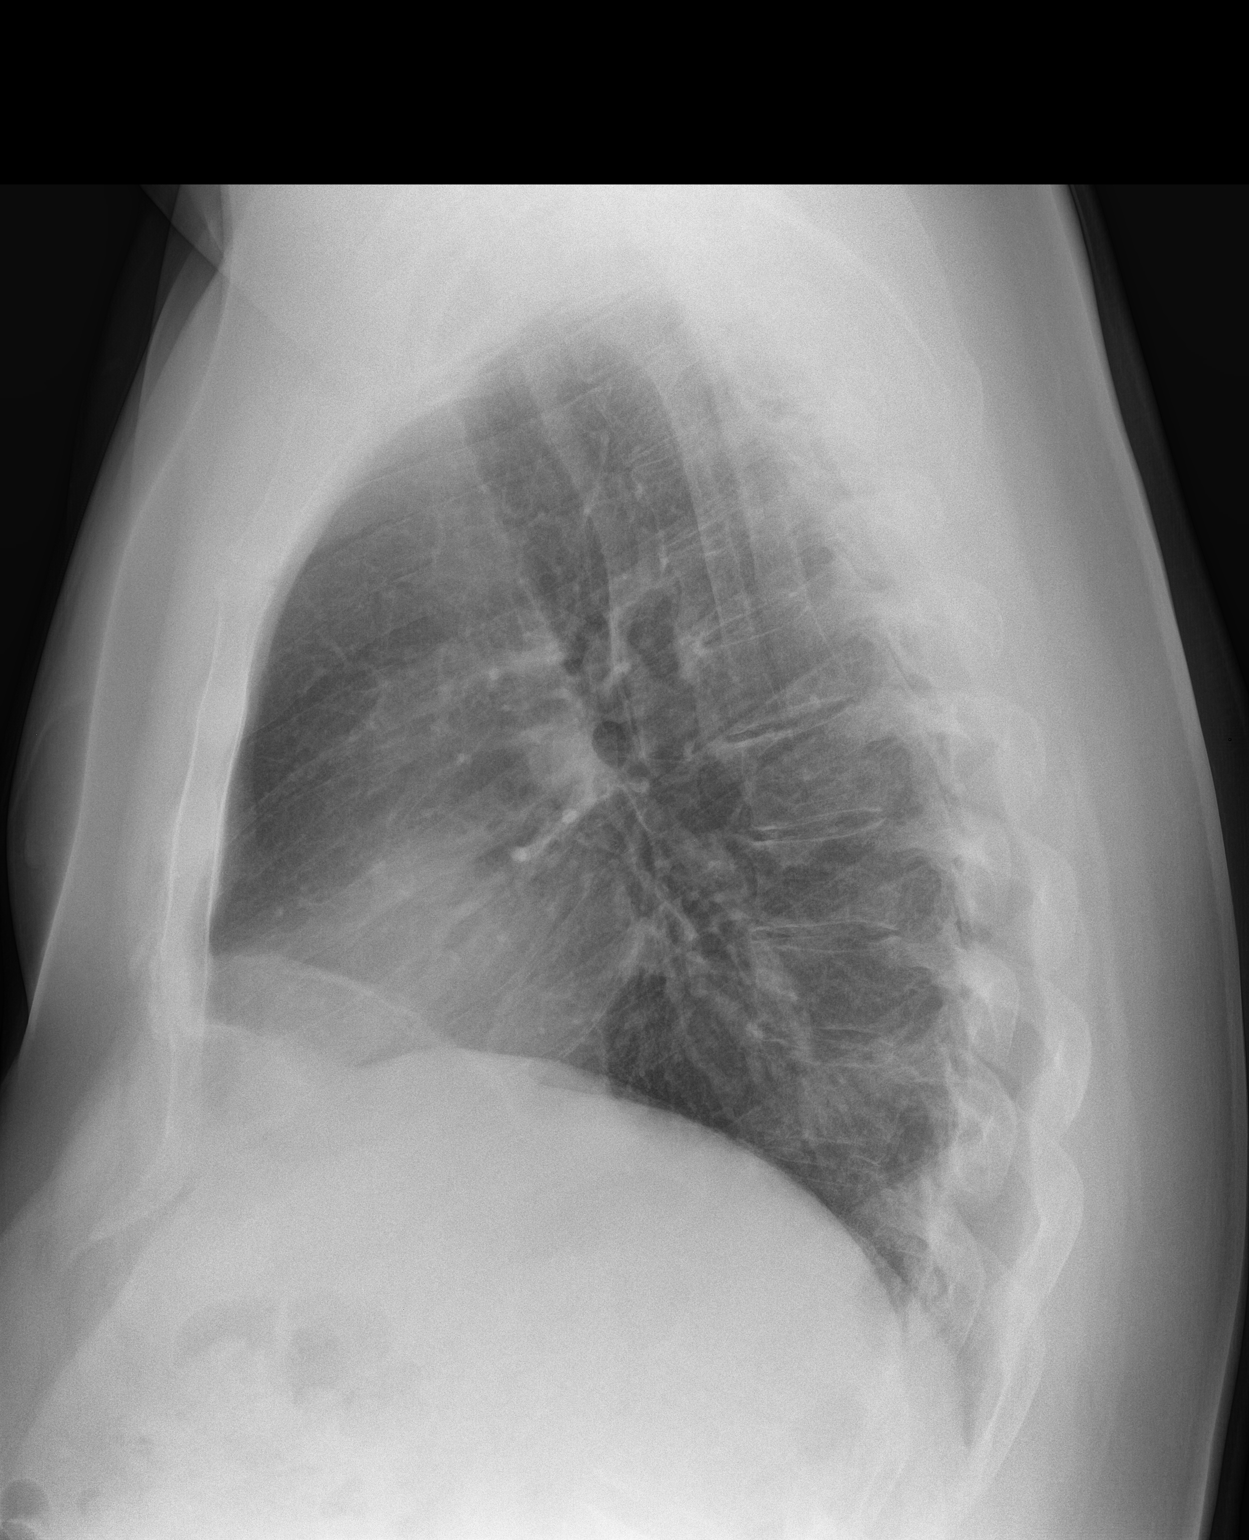

[2 of 2 positions shown; findings below may reference images not displayed]

FINDINGS: The heart size and mediastinal contours are within normal limits.
There is no focal infiltrate, pulmonary edema, or pleural effusion.
The visualized skeletal structures are unremarkable.
IMPRESSION: No active cardiopulmonary disease.
